# Patient Record
Sex: Female | Born: 1985
Health system: Southern US, Community
[De-identification: ages and names within clinical notes are randomized; demographics above are authoritative.]

## PROBLEM LIST (undated history)

## (undated) ENCOUNTER — Inpatient Hospital Stay (HOSPITAL_COMMUNITY): Payer: Self-pay

## (undated) DIAGNOSIS — N946 Dysmenorrhea, unspecified: Secondary | ICD-10-CM

## (undated) DIAGNOSIS — K589 Irritable bowel syndrome without diarrhea: Secondary | ICD-10-CM

## (undated) DIAGNOSIS — Z8619 Personal history of other infectious and parasitic diseases: Secondary | ICD-10-CM

## (undated) DIAGNOSIS — K449 Diaphragmatic hernia without obstruction or gangrene: Secondary | ICD-10-CM

## (undated) DIAGNOSIS — K219 Gastro-esophageal reflux disease without esophagitis: Secondary | ICD-10-CM

## (undated) DIAGNOSIS — G43909 Migraine, unspecified, not intractable, without status migrainosus: Secondary | ICD-10-CM

## (undated) HISTORY — DX: Personal history of other infectious and parasitic diseases: Z86.19

## (undated) HISTORY — PX: WISDOM TOOTH EXTRACTION: SHX21

## (undated) HISTORY — DX: Dysmenorrhea, unspecified: N94.6

## (undated) HISTORY — PX: ANAL SPHINCTEROPLASTY: SUR1305

## (undated) HISTORY — DX: Migraine, unspecified, not intractable, without status migrainosus: G43.909

---

## 2008-09-30 HISTORY — PX: OTHER SURGICAL HISTORY: SHX169

## 2011-09-12 ENCOUNTER — Ambulatory Visit
Admission: RE | Admit: 2011-09-12 | Discharge: 2011-09-12 | Disposition: A | Discharge status: Home / Self Care | Payer: BC Managed Care – PPO | Source: Ambulatory Visit | Attending: Family Medicine | Admitting: Family Medicine

## 2011-09-12 ENCOUNTER — Other Ambulatory Visit: Payer: Self-pay | Admitting: Family Medicine

## 2011-09-12 DIAGNOSIS — M545 Low back pain: Secondary | ICD-10-CM

## 2012-01-02 DIAGNOSIS — N946 Dysmenorrhea, unspecified: Secondary | ICD-10-CM

## 2012-01-02 DIAGNOSIS — G43909 Migraine, unspecified, not intractable, without status migrainosus: Secondary | ICD-10-CM

## 2012-01-02 DIAGNOSIS — Z8619 Personal history of other infectious and parasitic diseases: Secondary | ICD-10-CM

## 2012-01-06 ENCOUNTER — Encounter: Payer: Self-pay | Admitting: Obstetrics and Gynecology

## 2012-01-06 ENCOUNTER — Ambulatory Visit (INDEPENDENT_AMBULATORY_CARE_PROVIDER_SITE_OTHER): Payer: BC Managed Care – PPO | Admitting: Obstetrics and Gynecology

## 2012-01-06 VITALS — BP 110/74 | HR 100 | Ht 63.25 in | Wt 113.0 lb

## 2012-01-06 DIAGNOSIS — N390 Urinary tract infection, site not specified: Secondary | ICD-10-CM

## 2012-01-06 DIAGNOSIS — R1031 Right lower quadrant pain: Secondary | ICD-10-CM

## 2012-01-06 LAB — POCT URINALYSIS DIPSTICK
Bilirubin, UA: NEGATIVE
Glucose, UA: NEGATIVE
Ketones, UA: NEGATIVE
Spec Grav, UA: <=1.005
Urobilinogen, UA: NEGATIVE

## 2012-01-06 MED ORDER — LEVONORGESTREL-ETHINYL ESTRAD 0.1-20 MG-MCG PO TABS: 1.0000 | ORAL_TABLET | Freq: Every day | ORAL | Status: DC

## 2012-01-06 NOTE — Progress Notes (Signed)
AEX  Last Pap: 2012 per pt WNL: Yes Regular Periods:yes Contraception: pill  Monthly Breast exam:yes Tetanus<6yrs:yes Nl.Bladder Function:yes Daily BMs:yes Healthy Diet:yes Calcium:yes Mammogram:no Date of Mammogram: n/a Exercise:yes Have often Exercise: 2-3 times per week  Seatbelt: yes Abuse at home: no Stressful work:no Sigmoid-colonoscopy: n/a Bone Density: No PCP: Dr. Merri Brunette Change in PMH:  Change in ZOX:WRUE Subjective:    Carol Rocha is a 26 y.o. female, G0P0000, who presents for an annual exam. 2 month hx of occassional RLQ pain.  No nausea, vomiting, dysuria, frequency.  No fever or chills.Pt went to PCP for right side pain, PCP thought she may have small cysts on her ovary.    History   Social History  . Marital Status: Married    Spouse Name: N/A    Number of Children: N/A  . Years of Education: N/A   Social History Main Topics  . Smoking status: Never Smoker   . Smokeless tobacco: None  . Alcohol Use: No  . Drug Use: No  . Sexually Active: Yes    Birth Control/ Protection: Pill     lessina   Other Topics Concern  . None   Social History Narrative  . None    Menstrual cycle:   LMP: Patient's last menstrual period was 12/21/2011.           Cycle:regular withdrawal menses from birth control pills. The only breakthrough bleeding she had was doing traveling in 2 different times while in [place]  The following portions of the patient's history were reviewed and updated as appropriate: allergies, current medications, past family history, past medical history, past social history, past surgical history and problem list.  Review of Systems Pertinent items are noted in HPI. Breast:Negative for breast lump,nipple discharge or nipple retraction Gastrointestinal: Negative for , change in bowel habits or rectal bleeding Urinary:negative   Objective:    BP 110/74  Pulse 100  Ht 5' 3.25" (1.607 m)  Wt 113 lb (51.256 kg)  BMI 19.86 kg/m2   LMP 12/21/2011    Weight:  Wt Readings from Last 1 Encounters:  01/06/12 113 lb (51.256 kg)          BMI: Body mass index is 19.86 kg/(m^2).  General Appearance: Alert, appropriate appearance for age. No acute distress HEENT: Grossly normal Neck / Thyroid: Supple, no masses, nodes or enlargement Lungs: clear to auscultation bilaterally Back: No CVA tenderness Breast Exam: there are areas above the areolar her room of sunburn which has peeled bilaterally.  No masses or nodes.No dimpling, nipple retraction or discharge. Cardiovascular: Regular rate and rhythm. S1, S2, no murmur Gastrointestinal: Soft, non-tender, no masses or organomegaly Pelvic Exam: Vulva and vagina appear normal. Bimanual exam reveals normal uterus and adnexa. Rectovaginal: normal rectal, no masses Lymphatic Exam: Non-palpable nodes in neck, clavicular, axillary, or inguinal regions Skin: no rash or abnormalities Neurologic: Normal gait and speech, no tremor  Psychiatric: Alert and oriented, appropriate affect.   Wet Prep:not applicable Urinalysis:leukocytes trace UPT: none done   Assessment:    right lower quadrant pain  Rule out urinary tract infection  Plan:    pap smear due 2014 Pelvic ultrasound and visit STD screening: declined Contraception:oral contraceptives (estrogen/progesterone)      Gadiel John PMD

## 2012-01-08 LAB — URINE CULTURE
Colony Count: NO GROWTH
Organism ID, Bacteria: NO GROWTH

## 2012-01-22 ENCOUNTER — Encounter: Payer: Self-pay | Admitting: Obstetrics and Gynecology

## 2012-01-22 ENCOUNTER — Ambulatory Visit (INDEPENDENT_AMBULATORY_CARE_PROVIDER_SITE_OTHER): Payer: BC Managed Care – PPO | Admitting: Obstetrics and Gynecology

## 2012-01-22 ENCOUNTER — Other Ambulatory Visit: Payer: Self-pay | Admitting: Obstetrics and Gynecology

## 2012-01-22 ENCOUNTER — Ambulatory Visit (INDEPENDENT_AMBULATORY_CARE_PROVIDER_SITE_OTHER): Payer: BC Managed Care – PPO

## 2012-01-22 VITALS — BP 110/70 | Temp 98.6°F | Wt 112.0 lb

## 2012-01-22 DIAGNOSIS — R1031 Right lower quadrant pain: Secondary | ICD-10-CM

## 2012-01-22 NOTE — Progress Notes (Signed)
F/U rlq pain  SUBJECTIVE: Daily RLQ pain continues, not always associated with activity.  Pt also has right back pain which she associates with pilonidal cyst removal.  OBJECTIVE: BP 110/70  Temp 98.6 F (37 C)  Wt 112 lb (50.803 kg)  LMP 01/18/2012  ULTRASOUND: Uterus: Length: 6.26 cm   Width:  3.49 cm   Height:  3.02 cm  Endo thickness:  0.132cm   Left ovary:Normal Right ovary:Normal Fibroids:no  CDS fluid:no  Comment: Anteverted uterus. Normal uterus. Thin endometrium. Normal ovaries/adnexa. No CDS fluid.  ASSESSMENT: Pelvic pain in RLQ with nl pelvic and U/s exam.  Possible musculoskeletal etiology.  RECOMMENDATION: Referral to physical therapy F/U for aex or prn

## 2012-01-26 ENCOUNTER — Other Ambulatory Visit: Payer: BC Managed Care – PPO

## 2012-06-09 ENCOUNTER — Other Ambulatory Visit: Payer: Self-pay | Admitting: Family Medicine

## 2012-06-10 ENCOUNTER — Other Ambulatory Visit: Payer: Self-pay | Admitting: Family Medicine

## 2012-06-10 DIAGNOSIS — R599 Enlarged lymph nodes, unspecified: Secondary | ICD-10-CM

## 2012-06-14 ENCOUNTER — Ambulatory Visit
Admission: RE | Admit: 2012-06-14 | Discharge: 2012-06-14 | Disposition: A | Discharge status: Home / Self Care | Payer: BC Managed Care – PPO | Source: Ambulatory Visit | Attending: Family Medicine | Admitting: Family Medicine

## 2012-06-14 DIAGNOSIS — R599 Enlarged lymph nodes, unspecified: Secondary | ICD-10-CM

## 2013-09-09 ENCOUNTER — Other Ambulatory Visit: Payer: Self-pay | Admitting: Infectious Disease

## 2013-09-09 ENCOUNTER — Ambulatory Visit
Admission: RE | Admit: 2013-09-09 | Discharge: 2013-09-09 | Disposition: A | Discharge status: Home / Self Care | Payer: No Typology Code available for payment source | Source: Ambulatory Visit | Attending: Infectious Disease | Admitting: Infectious Disease

## 2013-09-09 DIAGNOSIS — A150 Tuberculosis of lung: Secondary | ICD-10-CM

## 2014-05-31 ENCOUNTER — Encounter (HOSPITAL_COMMUNITY): Payer: Self-pay | Admitting: *Deleted

## 2014-05-31 ENCOUNTER — Other Ambulatory Visit (HOSPITAL_COMMUNITY)
Admission: RE | Admit: 2014-05-31 | Discharge: 2014-05-31 | Disposition: A | Discharge status: Home / Self Care | Payer: BC Managed Care – PPO | Source: Ambulatory Visit | Attending: Emergency Medicine | Admitting: Emergency Medicine

## 2014-05-31 ENCOUNTER — Emergency Department (HOSPITAL_COMMUNITY)
Admission: EM | Admit: 2014-05-31 | Discharge: 2014-05-31 | Disposition: A | Discharge status: Home / Self Care | Payer: BC Managed Care – PPO | Source: Home / Self Care | Attending: Emergency Medicine | Admitting: Emergency Medicine

## 2014-05-31 DIAGNOSIS — N760 Acute vaginitis: Secondary | ICD-10-CM | POA: Diagnosis present

## 2014-05-31 DIAGNOSIS — Z01419 Encounter for gynecological examination (general) (routine) without abnormal findings: Secondary | ICD-10-CM | POA: Diagnosis not present

## 2014-05-31 DIAGNOSIS — R1031 Right lower quadrant pain: Secondary | ICD-10-CM

## 2014-05-31 LAB — POCT URINALYSIS DIP (DEVICE)
Bilirubin Urine: NEGATIVE
GLUCOSE, UA: NEGATIVE mg/dL
Ketones, ur: NEGATIVE mg/dL
Leukocytes, UA: NEGATIVE
NITRITE: NEGATIVE
PROTEIN: NEGATIVE mg/dL
Specific Gravity, Urine: 1.02 (ref 1.005–1.030)
Urobilinogen, UA: 0.2 mg/dL (ref 0.0–1.0)
pH: 7.5 (ref 5.0–8.0)

## 2014-05-31 LAB — CBC WITH DIFFERENTIAL/PLATELET
BASOS ABS: 0 10*3/uL (ref 0.0–0.1)
Basophils Relative: 0 % (ref 0–1)
EOS PCT: 1 % (ref 0–5)
Eosinophils Absolute: 0.1 10*3/uL (ref 0.0–0.7)
HEMATOCRIT: 36.4 % (ref 36.0–46.0)
Hemoglobin: 11.9 g/dL — ABNORMAL LOW (ref 12.0–15.0)
LYMPHS PCT: 23 % (ref 12–46)
Lymphs Abs: 1.5 10*3/uL (ref 0.7–4.0)
MCH: 28.5 pg (ref 26.0–34.0)
MCHC: 32.7 g/dL (ref 30.0–36.0)
MCV: 87.1 fL (ref 78.0–100.0)
MONOS PCT: 5 % (ref 3–12)
Monocytes Absolute: 0.3 10*3/uL (ref 0.1–1.0)
NEUTROS PCT: 71 % (ref 43–77)
Neutro Abs: 4.8 10*3/uL (ref 1.7–7.7)
PLATELETS: 347 10*3/uL (ref 150–400)
RBC: 4.18 MIL/uL (ref 3.87–5.11)
RDW: 13 % (ref 11.5–15.5)
WBC: 6.8 10*3/uL (ref 4.0–10.5)

## 2014-05-31 LAB — CERVICOVAGINAL ANCILLARY ONLY
WET PREP (BD AFFIRM): NEGATIVE
Wet Prep (BD Affirm): NEGATIVE
Wet Prep (BD Affirm): NEGATIVE

## 2014-05-31 LAB — POCT I-STAT, CHEM 8
BUN: 17 mg/dL (ref 6–23)
CALCIUM ION: 1.17 mmol/L (ref 1.12–1.23)
Chloride: 102 mEq/L (ref 96–112)
Creatinine, Ser: 0.7 mg/dL (ref 0.50–1.10)
Glucose, Bld: 122 mg/dL — ABNORMAL HIGH (ref 70–99)
HEMATOCRIT: 38 % (ref 36.0–46.0)
Hemoglobin: 12.9 g/dL (ref 12.0–15.0)
Potassium: 3.9 mmol/L (ref 3.5–5.1)
Sodium: 136 mmol/L (ref 135–145)
TCO2: 25 mmol/L (ref 0–100)

## 2014-05-31 LAB — POCT PREGNANCY, URINE: Preg Test, Ur: NEGATIVE

## 2014-05-31 MED ORDER — ONDANSETRON 4 MG PO TBDP: 4.0000 mg | ORAL_TABLET | Freq: Four times a day (QID) | ORAL | Status: DC | PRN

## 2014-05-31 MED ORDER — IBUPROFEN 800 MG PO TABS: 800.0000 mg | ORAL_TABLET | Freq: Three times a day (TID) | ORAL | Status: DC | PRN

## 2014-05-31 MED ORDER — HYDROCODONE-ACETAMINOPHEN 5-325 MG PO TABS: 1.0000 | ORAL_TABLET | Freq: Four times a day (QID) | ORAL | Status: DC | PRN

## 2014-05-31 NOTE — ED Notes (Signed)
Pt reports   rlq  Pain        Since  yest         Vomited  As  Well       Pt  Taking  Clindamycin      For  An impending        Dental procedure             Later  Today   She  Denies  Any       Vagina bleeding or  Discharge    Pt  Ambulated  To room  With a  Steady  Fluid  Gait

## 2014-05-31 NOTE — Discharge Instructions (Signed)
Abdominal Pain, Women °Abdominal (stomach, pelvic, or belly) pain can be caused by many things. It is important to tell your doctor: °· The location of the pain. °· Does it come and go or is it present all the time? °· Are there things that start the pain (eating certain foods, exercise)? °· Are there other symptoms associated with the pain (fever, nausea, vomiting, diarrhea)? °All of this is helpful to know when trying to find the cause of the pain. °CAUSES  °· Stomach: virus or bacteria infection, or ulcer. °· Intestine: appendicitis (inflamed appendix), regional ileitis (Crohn's disease), ulcerative colitis (inflamed colon), irritable bowel syndrome, diverticulitis (inflamed diverticulum of the colon), or cancer of the stomach or intestine. °· Gallbladder disease or stones in the gallbladder. °· Kidney disease, kidney stones, or infection. °· Pancreas infection or cancer. °· Fibromyalgia (pain disorder). °· Diseases of the female organs: °¨ Uterus: fibroid (non-cancerous) tumors or infection. °¨ Fallopian tubes: infection or tubal pregnancy. °¨ Ovary: cysts or tumors. °¨ Pelvic adhesions (scar tissue). °¨ Endometriosis (uterus lining tissue growing in the pelvis and on the pelvic organs). °¨ Pelvic congestion syndrome (female organs filling up with blood just before the menstrual period). °¨ Pain with the menstrual period. °¨ Pain with ovulation (producing an egg). °¨ Pain with an IUD (intrauterine device, birth control) in the uterus. °¨ Cancer of the female organs. °· Functional pain (pain not caused by a disease, may improve without treatment). °· Psychological pain. °· Depression. °DIAGNOSIS  °Your doctor will decide the seriousness of your pain by doing an examination. °· Blood tests. °· X-rays. °· Ultrasound. °· CT scan (computed tomography, special type of X-ray). °· MRI (magnetic resonance imaging). °· Cultures, for infection. °· Barium enema (dye inserted in the large intestine, to better view it with  X-rays). °· Colonoscopy (looking in intestine with a lighted tube). °· Laparoscopy (minor surgery, looking in abdomen with a lighted tube). °· Major abdominal exploratory surgery (looking in abdomen with a large incision). °TREATMENT  °The treatment will depend on the cause of the pain.  °· Many cases can be observed and treated at home. °· Over-the-counter medicines recommended by your caregiver. °· Prescription medicine. °· Antibiotics, for infection. °· Birth control pills, for painful periods or for ovulation pain. °· Hormone treatment, for endometriosis. °· Nerve blocking injections. °· Physical therapy. °· Antidepressants. °· Counseling with a psychologist or psychiatrist. °· Minor or major surgery. °HOME CARE INSTRUCTIONS  °· Do not take laxatives, unless directed by your caregiver. °· Take over-the-counter pain medicine only if ordered by your caregiver. Do not take aspirin because it can cause an upset stomach or bleeding. °· Try a clear liquid diet (broth or water) as ordered by your caregiver. Slowly move to a bland diet, as tolerated, if the pain is related to the stomach or intestine. °· Have a thermometer and take your temperature several times a day, and record it. °· Bed rest and sleep, if it helps the pain. °· Avoid sexual intercourse, if it causes pain. °· Avoid stressful situations. °· Keep your follow-up appointments and tests, as your caregiver orders. °· If the pain does not go away with medicine or surgery, you may try: °¨ Acupuncture. °¨ Relaxation exercises (yoga, meditation). °¨ Group therapy. °¨ Counseling. °SEEK MEDICAL CARE IF:  °· You notice certain foods cause stomach pain. °· Your home care treatment is not helping your pain. °· You need stronger pain medicine. °· You want your IUD removed. °· You feel faint or   lightheaded. °· You develop nausea and vomiting. °· You develop a rash. °· You are having side effects or an allergy to your medicine. °SEEK IMMEDIATE MEDICAL CARE IF:  °· Your  pain does not go away or gets worse. °· You have a fever. °· Your pain is felt only in portions of the abdomen. The right side could possibly be appendicitis. The left lower portion of the abdomen could be colitis or diverticulitis. °· You are passing blood in your stools (bright red or black tarry stools, with or without vomiting). °· You have blood in your urine. °· You develop chills, with or without a fever. °· You pass out. °MAKE SURE YOU:  °· Understand these instructions. °· Will watch your condition. °· Will get help right away if you are not doing well or get worse. °Document Released: 03/16/2007 Document Revised: 10/03/2013 Document Reviewed: 04/05/2009 °Exit[employer]® Patient Information ©2015 [employer]. This information is not intended to replace advice given to you by your health care provider. Make sure you discuss any questions you have with your health care provider. ° °

## 2014-05-31 NOTE — ED Provider Notes (Signed)
CSN: [id number]     Arrival date & time 05/31/14  [id number] History   First MD Initiated Contact with Patient 05/31/14 0845     Chief Complaint  Patient presents with  . Abdominal Pain   (Consider location/radiation/quality/duration/timing/severity/associated sxs/prior Treatment) HPI          28 year old female presents for evaluation of abdominal pain. She has right lower quadrant abdominal pain since last night. It is cramping in nature, nonradiating. She had vomiting last night as well. The pain was intermittent but has gotten to be more constant today. No fever, chills, constipation, diarrhea. She is currently on her period. No recent travel or sick contacts. No history of stomach surgeries. She is currently on clindamycin for a dental infection.  She is sexually active, denies vaginal discharge apart from bleeding from period.     Past Medical History  Diagnosis Date  . History of chicken pox   . Migraine   . Dysmenorrhea    Past Surgical History  Procedure Laterality Date  . Wisdom tooth extraction     History reviewed. No pertinent family history. History  Substance Use Topics  . Smoking status: Never Smoker   . Smokeless tobacco: Not on file  . Alcohol Use: No   OB History    Gravida Para Term Preterm AB TAB SAB Ectopic Multiple Living   0 0 0 0 0 0 0 0 0 0      Review of Systems  Constitutional: Negative for fever and chills.  Respiratory: Negative for shortness of breath.   Cardiovascular: Negative for chest pain.  Gastrointestinal: Positive for nausea, vomiting and abdominal pain. Negative for diarrhea, constipation and blood in stool.  Genitourinary: Positive for vaginal bleeding. Negative for dysuria, urgency, hematuria, vaginal discharge and vaginal pain.  All other systems reviewed and are negative.   Allergies  Review of patient's allergies indicates no known allergies.  Home Medications   Prior to Admission medications   Medication Sig Start Date End Date  Taking? Authorizing Provider  HYDROcodone-acetaminophen (NORCO) 5-325 MG per tablet Take 1 tablet by mouth every 6 (six) hours as needed for moderate pain. 05/31/14   Graylon GoodZachary H Samaya Boardley, PA-C  ibuprofen (ADVIL,MOTRIN) 800 MG tablet Take 1 tablet (800 mg total) by mouth every 8 (eight) hours as needed. 05/31/14   Graylon GoodZachary H Mahonri Seiden, PA-C  levonorgestrel-ethinyl estradiol (AVIANE,ALESSE,LESSINA) 0.1-20 MG-MCG tablet Take 1 tablet by mouth daily. 01/06/12   Hal MoralesVanessa P Haygood, MD  ondansetron (ZOFRAN-ODT) 4 MG disintegrating tablet Take 1 tablet (4 mg total) by mouth every 6 (six) hours as needed for nausea. PRN for nausea or vomiting 05/31/14   Graylon GoodZachary H Azreal Stthomas, PA-C   BP 122/82 mmHg  Pulse 109  Temp(Src) 98.2 F (36.8 C) (Oral)  Resp 16  SpO2 99%  LMP 05/26/2014 Physical Exam  Constitutional: She is oriented to person, place, and time. Vital signs are normal. She appears well-developed and well-nourished. No distress.  HENT:  Head: Normocephalic and atraumatic.  Cardiovascular: Normal rate, regular rhythm and normal heart sounds.   Pulse 100 when rechecked manually   Pulmonary/Chest: Effort normal and breath sounds normal. No respiratory distress.  Abdominal: Soft. Normal appearance and bowel sounds are normal. She exhibits no mass. There is no hepatosplenomegaly. There is no tenderness. There is no rigidity, no rebound, no guarding, no CVA tenderness, no tenderness at McBurney's point and negative Murphy's sign.  Genitourinary: Cervix exhibits no motion tenderness, no discharge and no friability. Right adnexum displays no mass, no tenderness and  no fullness. Left adnexum displays no mass, no tenderness and no fullness. There is bleeding in the vagina. No erythema or tenderness in the vagina. No vaginal discharge found.  Lymphadenopathy:       Right: No inguinal adenopathy present.       Left: No inguinal adenopathy present.  Neurological: She is alert and oriented to person, place, and time. She has  normal strength. Coordination normal.  Skin: Skin is warm and dry. No rash noted. She is not diaphoretic.  Psychiatric: She has a normal mood and affect. Judgment normal.  Nursing note and vitals reviewed.   ED Course  Procedures (including critical care time) Labs Review Labs Reviewed  CBC WITH DIFFERENTIAL - Abnormal; Notable for the following:    Hemoglobin 11.9 (*)    All other components within normal limits  POCT URINALYSIS DIP (DEVICE) - Abnormal; Notable for the following:    Hgb urine dipstick SMALL (*)    All other components within normal limits  POCT I-STAT, CHEM 8 - Abnormal; Notable for the following:    Glucose, Bld 122 (*)    All other components within normal limits  POCT PREGNANCY, URINE  CERVICOVAGINAL ANCILLARY ONLY    Imaging Review No results found.   MDM   1. Abdominal pain, RLQ    Abdomen is soft and nontender. She is afebrile, nontoxic. Labs are normal. Treat symptomatically for now, advised to go to the emergency department if worsening. No acute surgical abdomen suspected at this time   Meds ordered this encounter  Medications  . ibuprofen (ADVIL,MOTRIN) 800 MG tablet    Sig: Take 1 tablet (800 mg total) by mouth every 8 (eight) hours as needed.    Dispense:  20 tablet    Refill:  0    Order Specific Question:  Supervising Provider    Answer:  Lorenz CoasterKELLER, DAVID C [id number]]  . HYDROcodone-acetaminophen (NORCO) 5-325 MG per tablet    Sig: Take 1 tablet by mouth every 6 (six) hours as needed for moderate pain.    Dispense:  10 tablet    Refill:  0    Order Specific Question:  Supervising Provider    Answer:  Lorenz CoasterKELLER, DAVID C [id number]]  . ondansetron (ZOFRAN-ODT) 4 MG disintegrating tablet    Sig: Take 1 tablet (4 mg total) by mouth every 6 (six) hours as needed for nausea. PRN for nausea or vomiting    Dispense:  12 tablet    Refill:  0    Order Specific Question:  Supervising Provider    Answer:  Lorenz CoasterKELLER, DAVID C [[id number]]       Graylon GoodZachary H Larson Limones,  PA-C 05/31/14 [phone]

## 2014-06-01 LAB — CERVICOVAGINAL ANCILLARY ONLY
Chlamydia: NEGATIVE
Neisseria Gonorrhea: NEGATIVE

## 2014-07-06 ENCOUNTER — Other Ambulatory Visit: Payer: Self-pay | Admitting: Gastroenterology

## 2014-07-06 DIAGNOSIS — R110 Nausea: Secondary | ICD-10-CM

## 2014-07-06 DIAGNOSIS — R1033 Periumbilical pain: Secondary | ICD-10-CM

## 2014-07-27 ENCOUNTER — Ambulatory Visit (HOSPITAL_COMMUNITY)
Admission: RE | Admit: 2014-07-27 | Discharge: 2014-07-27 | Disposition: A | Discharge status: Home / Self Care | Payer: BLUE CROSS/BLUE SHIELD | Source: Ambulatory Visit | Attending: Gastroenterology | Admitting: Gastroenterology

## 2014-07-27 DIAGNOSIS — R1033 Periumbilical pain: Secondary | ICD-10-CM

## 2014-07-27 DIAGNOSIS — R112 Nausea with vomiting, unspecified: Secondary | ICD-10-CM | POA: Diagnosis not present

## 2014-07-27 DIAGNOSIS — R110 Nausea: Secondary | ICD-10-CM

## 2014-07-27 MED ORDER — SINCALIDE 5 MCG IJ SOLR
INTRAMUSCULAR | Status: AC
  Administered 2014-07-27: 1.09 ug via INTRAVENOUS
  Filled 2014-07-27: qty 5

## 2014-07-27 MED ORDER — TECHNETIUM TC 99M MEBROFENIN IV KIT
5.0000 | PACK | Freq: Once | INTRAVENOUS | Status: AC | PRN
  Administered 2014-07-27: 5 via INTRAVENOUS

## 2014-07-27 MED ORDER — SINCALIDE 5 MCG IJ SOLR
0.0200 ug/kg | Freq: Once | INTRAMUSCULAR | Status: AC
  Administered 2014-07-27: 1.09 ug via INTRAVENOUS

## 2014-12-15 LAB — OB RESULTS CONSOLE HEPATITIS B SURFACE ANTIGEN: HEP B S AG: NEGATIVE

## 2014-12-15 LAB — OB RESULTS CONSOLE RPR: RPR: NONREACTIVE

## 2014-12-15 LAB — OB RESULTS CONSOLE GC/CHLAMYDIA
Chlamydia: NEGATIVE
GC PROBE AMP, GENITAL: NEGATIVE

## 2014-12-15 LAB — OB RESULTS CONSOLE HIV ANTIBODY (ROUTINE TESTING): HIV: NONREACTIVE

## 2014-12-15 LAB — OB RESULTS CONSOLE ANTIBODY SCREEN: ANTIBODY SCREEN: NEGATIVE

## 2014-12-15 LAB — OB RESULTS CONSOLE RUBELLA ANTIBODY, IGM: RUBELLA: IMMUNE

## 2014-12-15 LAB — OB RESULTS CONSOLE ABO/RH: RH Type: POSITIVE

## 2015-06-03 ENCOUNTER — Inpatient Hospital Stay (HOSPITAL_COMMUNITY)
Admission: AD | Admit: 2015-06-03 | Payer: BLUE CROSS/BLUE SHIELD | Source: Ambulatory Visit | Admitting: Obstetrics and Gynecology

## 2015-06-03 NOTE — L&D Delivery Note (Signed)
Delivery Note After two and a hours of active pushing and management, patient reports exhaustion.  FHR remained reassuring and pushing efforts remained optimal.  However, Dr. Delrae Sawyers. Jamond Neels called to the bedside for consult and assessment.  In room to assess and discuss availability, including r/b of vacuum extraction.  Patient opts to push a longer and delivered as below with staff and husband support.   At 1:16 AM, on Jul 09, 2015, a viable female "Carol Rocha" was delivered via Vaginal, Spontaneous Delivery (Presentation: Left Occiput Anterior) by Dr. Gerald Leitzara [facility]ks[facility].  Shoulders delivered easily and infant with good tone and spontaneous cry. Tactile stimulation and bulb suction given by doctor and infant placed on mother's abdomen where nurse continued tactile stimulation.  Infant  APGAR: 9, 9. Cord clamped, cut, and blood collected. Placenta delivered spontaneously and noted to be intact with 3VC upon inspection by provider.  Vaginal inspection revealed a 4th degree perineal laceration that was repaired by Dr. Gerald Leitzara Ary Rudnick.  Lidocaine 1% was utilized locally and patient tolerated the procedure well. Fundus firm, at the umbilicus, and bleeding small.  Mother hemodynamically stable and infant skin to skin prior to provider exit.  Mother desires pills and condoms for birth control and opts to breastfeed.  Family wishes for infant to be circumcised during inpatient stay.  Infant weight at one hour of life: 8 lb 3.4 oz (3725 g).    Anesthesia: Epidural  Episiotomy: None Lacerations: 4th degree Suture Repair: 2.0 3.0 vicryl monocryl Est. Blood Loss (mL):    Mom to postpartum.  Baby to Couplet care / Skin to Skin.  Cherre RobinsJessica L Emly MSN, CNM 08/06/2015, 2:01 AM  Agree with above. Pt was noted to have a 4 th degree perineal laceration The rectal mucosa was re-approximated with 4-0 vicryl. The Anal sphincter was re-approximated with 2-0 monocryl. The bulbocavernosus muscle and  vaginal mucosa were re-approximated with  3-0 vicryl. Excellent hemostasis was noted. Pt tolerated the repair well. Plan to start stool softener daily postpartum.

## 2015-07-17 LAB — OB RESULTS CONSOLE GBS: GBS: NEGATIVE

## 2015-08-05 ENCOUNTER — Inpatient Hospital Stay (HOSPITAL_COMMUNITY)
Admission: AD | Admit: 2015-08-05 | Discharge: 2015-08-08 | DRG: 775 | Disposition: A | Discharge status: Home / Self Care | Payer: BLUE CROSS/BLUE SHIELD | Source: Ambulatory Visit | Attending: Obstetrics and Gynecology | Admitting: Obstetrics and Gynecology

## 2015-08-05 ENCOUNTER — Inpatient Hospital Stay (HOSPITAL_COMMUNITY): Payer: BLUE CROSS/BLUE SHIELD | Admitting: Anesthesiology

## 2015-08-05 ENCOUNTER — Encounter (HOSPITAL_COMMUNITY): Payer: Self-pay | Admitting: *Deleted

## 2015-08-05 DIAGNOSIS — O9081 Anemia of the puerperium: Secondary | ICD-10-CM | POA: Diagnosis not present

## 2015-08-05 DIAGNOSIS — O4292 Full-term premature rupture of membranes, unspecified as to length of time between rupture and onset of labor: Principal | ICD-10-CM | POA: Diagnosis present

## 2015-08-05 DIAGNOSIS — Z3A38 38 weeks gestation of pregnancy: Secondary | ICD-10-CM | POA: Diagnosis not present

## 2015-08-05 DIAGNOSIS — D62 Acute posthemorrhagic anemia: Secondary | ICD-10-CM | POA: Diagnosis not present

## 2015-08-05 HISTORY — DX: Irritable bowel syndrome, unspecified: K58.9

## 2015-08-05 HISTORY — DX: Gastro-esophageal reflux disease without esophagitis: K21.9

## 2015-08-05 HISTORY — DX: Diaphragmatic hernia without obstruction or gangrene: K44.9

## 2015-08-05 LAB — TYPE AND SCREEN
ABO/RH(D): O POS
ANTIBODY SCREEN: NEGATIVE

## 2015-08-05 LAB — CBC
HCT: 40 % (ref 36.0–46.0)
Hemoglobin: 14 g/dL (ref 12.0–15.0)
MCH: 31.8 pg (ref 26.0–34.0)
MCHC: 35 g/dL (ref 30.0–36.0)
MCV: 90.9 fL (ref 78.0–100.0)
Platelets: 297 10*3/uL (ref 150–400)
RBC: 4.4 MIL/uL (ref 3.87–5.11)
RDW: 13.3 % (ref 11.5–15.5)
WBC: 15.3 10*3/uL — ABNORMAL HIGH (ref 4.0–10.5)

## 2015-08-05 LAB — ABO/RH: ABO/RH(D): O POS

## 2015-08-05 MED ORDER — ACETAMINOPHEN 325 MG PO TABS: 650.0000 mg | ORAL_TABLET | ORAL | Status: DC | PRN

## 2015-08-05 MED ORDER — ACETAMINOPHEN 500 MG PO TABS
1000.0000 mg | ORAL_TABLET | Freq: Once | ORAL | Status: AC
  Administered 2015-08-05: 1000 mg via ORAL
  Filled 2015-08-05: qty 2

## 2015-08-05 MED ORDER — BUTORPHANOL TARTRATE 1 MG/ML IJ SOLN: 1.0000 mg | INTRAMUSCULAR | Status: DC | PRN

## 2015-08-05 MED ORDER — OXYCODONE-ACETAMINOPHEN 5-325 MG PO TABS: 2.0000 | ORAL_TABLET | ORAL | Status: DC | PRN

## 2015-08-05 MED ORDER — OXYTOCIN BOLUS FROM INFUSION
500.0000 mL | INTRAVENOUS | Status: DC
  Administered 2015-08-06: 500 mL via INTRAVENOUS

## 2015-08-05 MED ORDER — LACTATED RINGERS IV SOLN
500.0000 mL | INTRAVENOUS | Status: DC | PRN
  Administered 2015-08-05: 500 mL via INTRAVENOUS

## 2015-08-05 MED ORDER — DIPHENHYDRAMINE HCL 50 MG/ML IJ SOLN: 12.5000 mg | INTRAMUSCULAR | Status: DC | PRN

## 2015-08-05 MED ORDER — LACTATED RINGERS IV SOLN: 500.0000 mL | Freq: Once | INTRAVENOUS | Status: DC

## 2015-08-05 MED ORDER — EPHEDRINE 5 MG/ML INJ: 10.0000 mg | INTRAVENOUS | Status: DC | PRN

## 2015-08-05 MED ORDER — OXYTOCIN 10 UNIT/ML IJ SOLN
2.5000 [IU]/h | INTRAMUSCULAR | Status: DC
  Filled 2015-08-05: qty 10

## 2015-08-05 MED ORDER — FENTANYL 2.5 MCG/ML BUPIVACAINE 1/10 % EPIDURAL INFUSION (WH - ANES): 14.0000 mL/h | INTRAMUSCULAR | Status: DC | PRN

## 2015-08-05 MED ORDER — CITRIC ACID-SODIUM CITRATE 334-500 MG/5ML PO SOLN: 30.0000 mL | ORAL | Status: DC | PRN

## 2015-08-05 MED ORDER — LACTATED RINGERS IV SOLN
INTRAVENOUS | Status: DC
  Administered 2015-08-05 (×3): via INTRAVENOUS

## 2015-08-05 MED ORDER — LACTATED RINGERS IV SOLN
500.0000 mL | Freq: Once | INTRAVENOUS | Status: AC
  Administered 2015-08-05: 500 mL via INTRAVENOUS

## 2015-08-05 MED ORDER — PHENYLEPHRINE 40 MCG/ML (10ML) SYRINGE FOR IV PUSH (FOR BLOOD PRESSURE SUPPORT): 80.0000 ug | PREFILLED_SYRINGE | INTRAVENOUS | Status: DC | PRN

## 2015-08-05 MED ORDER — LIDOCAINE HCL (PF) 1 % IJ SOLN
INTRAMUSCULAR | Status: DC | PRN
  Administered 2015-08-05 (×2): 5 mL via EPIDURAL

## 2015-08-05 MED ORDER — PHENYLEPHRINE 40 MCG/ML (10ML) SYRINGE FOR IV PUSH (FOR BLOOD PRESSURE SUPPORT)
80.0000 ug | PREFILLED_SYRINGE | INTRAVENOUS | Status: DC | PRN
  Administered 2015-08-05: 80 ug via INTRAVENOUS
  Filled 2015-08-05: qty 2

## 2015-08-05 MED ORDER — LIDOCAINE HCL (PF) 1 % IJ SOLN
30.0000 mL | INTRAMUSCULAR | Status: AC | PRN
  Administered 2015-08-06: 30 mL via SUBCUTANEOUS
  Filled 2015-08-05: qty 30

## 2015-08-05 MED ORDER — PHENYLEPHRINE 40 MCG/ML (10ML) SYRINGE FOR IV PUSH (FOR BLOOD PRESSURE SUPPORT)
80.0000 ug | PREFILLED_SYRINGE | INTRAVENOUS | Status: DC | PRN
  Filled 2015-08-05: qty 20
  Filled 2015-08-05: qty 2
  Filled 2015-08-05: qty 20

## 2015-08-05 MED ORDER — OXYTOCIN 10 UNIT/ML IJ SOLN
1.0000 m[IU]/min | INTRAVENOUS | Status: DC
  Administered 2015-08-05: 2 m[IU]/min via INTRAVENOUS

## 2015-08-05 MED ORDER — EPHEDRINE 5 MG/ML INJ
10.0000 mg | INTRAVENOUS | Status: DC | PRN
  Filled 2015-08-05: qty 2

## 2015-08-05 MED ORDER — FENTANYL 2.5 MCG/ML BUPIVACAINE 1/10 % EPIDURAL INFUSION (WH - ANES)
14.0000 mL/h | INTRAMUSCULAR | Status: DC | PRN
  Administered 2015-08-05 (×2): 14 mL/h via EPIDURAL
  Filled 2015-08-05: qty 125

## 2015-08-05 MED ORDER — ONDANSETRON HCL 4 MG/2ML IJ SOLN: 4.0000 mg | Freq: Four times a day (QID) | INTRAMUSCULAR | Status: DC | PRN

## 2015-08-05 MED ORDER — OXYCODONE-ACETAMINOPHEN 5-325 MG PO TABS: 1.0000 | ORAL_TABLET | ORAL | Status: DC | PRN

## 2015-08-05 NOTE — Progress Notes (Signed)
Labor Progress  Subjective: No complaints.  On right with peanut.  Extremely numb legs, no feeling.  No urge to push  Objective: BP 88/41 mmHg  Pulse 82  Temp(Src) 98.2 F (36.8 C) (Oral)  Resp 18  Ht 5\' 3"  (1.6 m)  Wt 160 lb (72.576 kg)  BMI 28.35 kg/m2  SpO2 100%  LMP 12/08/2014    FHT: 140, moderate variability, + accel, no decels CTX:  regular, every 4-5 minutes Uterus gravid, soft non tender SVE:  C/C/+1   Assessment:  IUP at 38.5 weeks NICHD: Category  1 Membranes:  SROM x 9.5hrs, no s/s of infection Labor progress: transition GBS: negtive  Plan: Continue labor plan Continuous monitoring Rest Labor down with peanut ball, moving from side to side to encourage fetal descent       Carol Rocha, CNM, MSN 08/05/2015. 5:35 PM

## 2015-08-05 NOTE — Consults (Signed)
  Anesthesia Pain Consult Note  Patient: Carol Rocha, 30 y.o., female  Consult Requested by: Osborn CohoAngela Roberts, MD  Reason for Consult: Crna pain rounds  Level of Consciousness: alert  Pain: current pain 9, pain goal 6 planning epidural     [facility] Hospital[facility]GER,Carol Rocha 08/05/2015

## 2015-08-05 NOTE — H&P (Signed)
Carol Rocha is a 30 y.o. female, G1 P0 at 38.5 weeks  Patient Active Problem List   Diagnosis Date Noted  . History of chicken pox   . Migraine   . Dysmenorrhea     Pregnancy Course: Patient entered care at 5.3 weeks.   EDC of 08/14/15 was established by US.      US evaluations:   12.3 weeks -1st trimester screen. WNL,  FHR 152, anterverted uterus  19.1 weeks - Anatomy: EFW 11oz, cervix 3.68, FHR 156, vertex, posterior placenta, no previa, boy      Significant prenatal events:   none   Last evaluation:   38.5 weeks   VE:0/30/-3 on/28/17  Reason for admission:  SROM at 0800  Pt States:   Contractions Frequency: 3-4         Contraction severity: strong         Fetal activity: +FM  OB History    Gravida Para Term Preterm AB TAB SAB Ectopic Multiple Living   1 0 0 0 0 0 0 0 0 0      Past Medical History  Diagnosis Date  . History of chicken pox   . Migraine   . Dysmenorrhea    Past Surgical History  Procedure Laterality Date  . Wisdom tooth extraction     Family History: family history is not on file. Social History:  reports that she has never smoked. She does not have any smokeless tobacco history on file. She reports that she does not drink alcohol or use illicit drugs.   Prenatal Transfer Tool  Maternal Diabetes: No Genetic Screening: Normal Maternal Ultrasounds/Referrals: Normal Fetal Ultrasounds or other Referrals:  None Maternal Substance Abuse:  No Significant Maternal Medications:  None Significant Maternal Lab Results: None   ROS:  See HPI above, all other systems are negative  No Known Allergies  Dilation: 5 Exam by:: V. Abrie Egloff, CNM Blood pressure 116/61, pulse 106, temperature 97.8 F (36.6 C), temperature source Oral, resp. rate 98, last menstrual period 12/08/2014.  Maternal Exam:  Uterine Assessment: Contraction frequency is rare.  Abdomen: Gravid, non tender. Fundal height is aga.  Normal external genitalia, vulva, cervix, uterus and  adnexa.  No lesions noted on exam.  Pelvis adequate for delivery.  Fetal presentation: Vertex by VE  Fetal Exam:  Monitor Surveillance : Continuous Monitoring / Mode: Ultrasound.  NICHD: Category 1 CTXs: Q 4-295minutes EFW   7.5 lbs  Physical Exam: Nursing note and vitals reviewed General: alert and cooperative She appears well nourished Psychiatric: Normal mood and affect. Her behavior is normal Head: Normocephalic Eyes: Pupils are equal, round, and reactive to light Neck: Normal range of motion Cardiovascular: RRR without murmur  Respiratory: CTAB. Effort normal  Abd: soft, non-tender, +BS, no rebound, no guarding  Genitourinary: Vagina normal  Neurological: A&Ox3 Skin: Warm and dry  Musculoskeletal: Normal range of motion  Homan's sign negative bilaterally No evidence of DVTs.  Edema: Minimal bilaterally non-pitting edema DTR: 2+ Clonus: None   Prenatal labs: ABO, Rh:  O positive Antibody:  neg Rubella:  immune RPR:   NR HBsAg:   neg HIV:   NR GBS:  neg 07/17/15 Sickle cell/Hgb electrophoresis:  WNL Pap:  wnl 01/20/15 GC:   neg Chlamydia: neg Genetic screenings:  wnl Glucola:  neg  Assessment:  IUP at 38.5 weeks NICHD: Category 1 Membranes: SROM x 5hrs GBS neg  Plan:  Admit to L&D for expectant/active management of labor. Possible augmentation options reviewed including pitocin.  IV pain  medication per orders PRN Epidural per patient request Foley cath after patient is comfortable with epidural Anticipate SVD  Labor mgmt as ordered   Okay to ambulate around unit with wireless monitors  Okay to get up and shower without monitoring    Attending MD available at all times.    Lavoris Canizales, CNM, MSN 08/05/2015, 12:46 PM

## 2015-08-05 NOTE — Progress Notes (Signed)
Carol Rocha MRN: [id number]  Subjective: -In room to resume pushing after decrease in epidural.  Patient continues to deny rectal and vaginal pressure.    Objective: BP 95/69 mmHg  Pulse 157  Temp(Src) 99.7 F (37.6 C) (Oral)  Resp 20  Ht 5\' 3"  (1.6 m)  Wt 72.576 kg (160 lb)  BMI 28.35 kg/m2  SpO2 100%  LMP 12/08/2014   Total I/O In: -  Out: 500 [Urine:500]  Fetal Monitoring: FHT: 160 bpm, Mod Var, +Variable Decels, +Accels UC: Q623min, palpates moderate    Vaginal Exam: SVE:   Dilation: 10 Effacement (%): 100 Station: +2 Exam by:: Faaris Arizpe, CNM Membranes:SROM Internal Monitors: None  Augmentation/Induction: Pitocin:None Cytotec: None  Assessment:  IUP at 38.6wks Cat I FT  2nd Stage Labor  Plan: -Start pushing -Will remain at bedside -Anticipate SVD -Continue other mgmt as ordered  Valma CavaJessica L Sparrow [place],MSN, CNM 08/05/2015, 10:48 PM   Addendum (2330) -Dr. Delrae Sawyers. Cole updated on patient status.  Instructed to discontinue epidural to allow for perception of rectal pressure. Also instructed to start pitocin for augmentation -Will start pitocin at 582mUn/min -Epidural infusion discontinued, Dr. Roxanna Mew. [place] notified -Will remain at bedside and continue pushing  Cherre RobinsJessica L Rozalyn Osland MSN, CNM

## 2015-08-05 NOTE — Progress Notes (Signed)
Labor Progress  Subjective: No complaints.  Dense epidural.  Unable to feel VE, no desire to push or the urge for a BM  Objective: BP 111/56 mmHg  Pulse 86  Temp(Src) 98.2 F (36.8 C) (Oral)  Resp 18  Ht 5\' 3"  (1.6 m)  Wt 160 lb (72.576 kg)  BMI 28.35 kg/m2  SpO2 100%  LMP 12/08/2014     FHT: 130, moderate variability, + accel, no decel CTX:  regular, every 4-5 minutes Uterus gravid, soft non tender SVE:  Dilation: 10 Effacement (%): 100 Station: +1 Exam by:: V Wendall Isabell, CNM   Assessment:  IUP at 38.5 weeks NICHD: Category 1 Membranes:  SROM x 10.5hrs, no s/s of infection Labor progress: laboring down GBS: negative  Plan: Continue labor plan Continuous monitoring Frequent position changes to facilitate fetal rotation and descent. Report [facility]     Carol Rocha, CNM, MSN 08/05/2015. 6:37 PM

## 2015-08-05 NOTE — Anesthesia Procedure Notes (Signed)
Epidural Patient location during procedure: OB Start time: 08/05/2015 1:57 PM End time: 08/05/2015 2:02 PM  Staffing Anesthesiologist: Ronelle NighEWELL, Chasta Deshpande Performed by: anesthesiologist   Preanesthetic Checklist Completed: patient identified, site marked, surgical consent, pre-op evaluation, timeout performed, IV checked, risks and benefits discussed and monitors and equipment checked  Epidural Patient position: sitting Prep: site prepped and draped and DuraPrep Patient monitoring: continuous pulse ox and blood pressure Approach: midline Location: L3-L4 Injection technique: LOR air  Needle:  Needle type: Tuohy  Needle gauge: 17 G Needle length: 9 cm and 9 Needle insertion depth: 6 cm Catheter type: closed end flexible Catheter size: 19 Gauge Catheter at skin depth: 11 cm Test dose: negative  Assessment Sensory level: T10 Events: blood not aspirated, injection not painful, no injection resistance, negative IV test and no paresthesia  Additional Notes Patient identified. Risks/Benefits/Options discussed with patient including but not limited to bleeding, infection, nerve damage, paralysis, failed block, incomplete pain control, headache, blood pressure changes, nausea, vomiting, reactions to medication both or allergic, itching and postpartum back pain. Confirmed with bedside nurse the patient's most recent platelet count. Confirmed with patient that they are not currently taking any anticoagulation, have any bleeding history or any family history of bleeding disorders. Patient expressed understanding and wished to proceed. All questions were answered. Sterile technique was used throughout the entire procedure. Please see nursing notes for vital signs. Test dose was given through epidural catheter and negative prior to continuing to dose epidural or start infusion. Warning signs of high block given to the patient including shortness of breath, tingling/numbness in hands, complete motor block,  or any concerning symptoms with instructions to call for help. Patient was given instructions on fall risk and not to get out of bed. All questions and concerns addressed with instructions to call with any issues or inadequate analgesia.

## 2015-08-05 NOTE — Progress Notes (Signed)
Labor Progress  Subjective: Just received an epidural, feeling a little better  Objective: BP 107/60 mmHg  Pulse 92  Temp(Src) 97.8 F (36.6 C) (Oral)  Resp 20  Ht 5\' 3"  (1.6 m)  Wt 160 lb (72.576 kg)  BMI 28.35 kg/m2  SpO2 99%  LMP 12/08/2014     FHT: 140, moderate variability, + accel, no decel CTX:  regular, every 4-5 minutes Uterus gravid, soft non tender SVE:  Dilation: 8.5 Effacement (%): 80 Station: -1 Exam by:: Carol Rocha, CNM   Assessment:  IUP at 38.5 weeks NICHD: Category 1 Membranes:  SROM x 6.5hrs, no s/s of infection Labor progress: adquate labor GBS: negative   Plan: Continue labor plan Continuous monitoring Frequent position changes to facilitate fetal rotation and descent. Will reassess with cervical exam at 1700 or earlier if necessary      Carol Rocha, CNM, MSN 08/05/2015. 2:51 PM

## 2015-08-05 NOTE — Anesthesia Preprocedure Evaluation (Signed)
Anesthesia Evaluation  Patient identified by MRN, date of birth, ID band Patient awake    Reviewed: Allergy & Precautions, H&P , NPO status , Patient's Chart, lab work & pertinent test results  Airway Mallampati: II  TM Distance: >3 FB Neck ROM: full    Dental no notable dental hx. (+) Teeth Intact, Dental Advisory Given   Pulmonary neg pulmonary ROS,    Pulmonary exam normal breath sounds clear to auscultation       Cardiovascular Exercise Tolerance: Good negative cardio ROS Normal cardiovascular exam Rhythm:regular Rate:Normal     Neuro/Psych negative neurological ROS  negative psych ROS   GI/Hepatic negative GI ROS, Neg liver ROS, hiatal hernia, GERD  Medicated and Controlled,  Endo/Other  negative endocrine ROS  Renal/GU negative Renal ROS  negative genitourinary   Musculoskeletal   Abdominal   Peds  Hematology negative hematology ROS (+)   Anesthesia Other Findings   Reproductive/Obstetrics negative OB ROS (+) Pregnancy                             Anesthesia Physical Anesthesia Plan  ASA: II  Anesthesia Plan: Epidural   Post-op Pain Management:    Induction:   Airway Management Planned:   Additional Equipment:   Intra-op Plan:   Post-operative Plan:   Informed Consent: I have reviewed the patients History and Physical, chart, labs and discussed the procedure including the risks, benefits and alternatives for the proposed anesthesia with the patient or authorized representative who has indicated his/her understanding and acceptance.   Dental Advisory Given  Plan Discussed with: CRNA and Surgeon  Anesthesia Plan Comments:         Anesthesia Quick Evaluation

## 2015-08-05 NOTE — Progress Notes (Signed)
Carol Rocha MRN: [id number]  Subjective: -Care assumed of 30y.o. G1P0 at 38.5wks who presents for PROM at 0800 and contractions at 0900.  Patient is GBS negative and no significant PN history per HnP.  In room to meet acquaintance and evaluate.  Patient having some nausea and continues to deny feelings of pressure. Husband at bedside, supportive.   Objective: BP 121/58 mmHg  Pulse 101  Temp(Src) 100 F (37.8 C) (Oral)  Resp 20  Ht 5\' 3"  (1.6 m)  Wt 72.576 kg (160 lb)  BMI 28.35 kg/m2  SpO2 100%  LMP 12/08/2014      Fetal Monitoring: FHT: 145 bpm, Mod Var, -Decels, +Accels UC: Q2-553min, palpates moderate    Vaginal Exam: SVE:   Dilation: 10 Effacement (%): 100 Station: +3 Exam by::  Membranes:SROM x 12hrs Internal Monitors: None  Augmentation/Induction: Pitocin:None Cytotec: None  Assessment:  IUP at 38.5wks Cat I FT  2nd Stage Labor Febrile  Plan: -Attempted pushing with no success, patient with no perception of vaginal/rectal pressure despite fetal station and provider pressure -Will call anesthesiologist to decrease epidural as appropriate -Position change to promote fetal rotation and further descent -Continue other mgmt as ordered  Valma CavaJessica L Kaislyn Gulas,MSN, CNM 08/05/2015, 8:28 PM

## 2015-08-05 NOTE — MAU Note (Signed)
Report given to [employer] RN Charge BS received room assignment of [id number]

## 2015-08-05 NOTE — MAU Note (Signed)
Pt states water broke around 0800.  Pt began having contractions around 0900 that have gotten worse every 5 min

## 2015-08-06 ENCOUNTER — Encounter (HOSPITAL_COMMUNITY): Payer: Self-pay

## 2015-08-06 LAB — CBC
HEMATOCRIT: 30 % — AB (ref 36.0–46.0)
HEMOGLOBIN: 10.4 g/dL — AB (ref 12.0–15.0)
MCH: 31.4 pg (ref 26.0–34.0)
MCHC: 34.7 g/dL (ref 30.0–36.0)
MCV: 90.6 fL (ref 78.0–100.0)
Platelets: 271 10*3/uL (ref 150–400)
RBC: 3.31 MIL/uL — AB (ref 3.87–5.11)
RDW: 13.5 % (ref 11.5–15.5)
WBC: 28.8 10*3/uL — ABNORMAL HIGH (ref 4.0–10.5)

## 2015-08-06 LAB — RPR: RPR: NONREACTIVE

## 2015-08-06 MED ORDER — DOCUSATE SODIUM 100 MG PO CAPS
100.0000 mg | ORAL_CAPSULE | Freq: Two times a day (BID) | ORAL | Status: DC
  Administered 2015-08-06 – 2015-08-08 (×4): 100 mg via ORAL
  Filled 2015-08-06 (×4): qty 1

## 2015-08-06 MED ORDER — SENNOSIDES-DOCUSATE SODIUM 8.6-50 MG PO TABS
2.0000 | ORAL_TABLET | Freq: Once | ORAL | Status: AC
  Administered 2015-08-06: 2 via ORAL
  Filled 2015-08-06: qty 2

## 2015-08-06 MED ORDER — FAMOTIDINE 20 MG PO TABS: 10.0000 mg | ORAL_TABLET | Freq: Two times a day (BID) | ORAL | Status: DC

## 2015-08-06 MED ORDER — OXYCODONE-ACETAMINOPHEN 5-325 MG PO TABS
1.0000 | ORAL_TABLET | ORAL | Status: DC | PRN
  Administered 2015-08-06 – 2015-08-08 (×8): 1 via ORAL
  Filled 2015-08-06 (×8): qty 1

## 2015-08-06 MED ORDER — IBUPROFEN 600 MG PO TABS
600.0000 mg | ORAL_TABLET | Freq: Four times a day (QID) | ORAL | Status: DC
  Administered 2015-08-06 – 2015-08-08 (×9): 600 mg via ORAL
  Filled 2015-08-06 (×9): qty 1

## 2015-08-06 MED ORDER — DIBUCAINE 1 % RE OINT: 1.0000 "application " | TOPICAL_OINTMENT | RECTAL | Status: DC | PRN

## 2015-08-06 MED ORDER — ZOLPIDEM TARTRATE 5 MG PO TABS: 5.0000 mg | ORAL_TABLET | Freq: Every evening | ORAL | Status: DC | PRN

## 2015-08-06 MED ORDER — ONDANSETRON HCL 4 MG PO TABS: 4.0000 mg | ORAL_TABLET | ORAL | Status: DC | PRN

## 2015-08-06 MED ORDER — LANOLIN HYDROUS EX OINT
TOPICAL_OINTMENT | CUTANEOUS | Status: DC | PRN
Start: 2015-08-06 — End: 2015-08-08

## 2015-08-06 MED ORDER — OXYCODONE-ACETAMINOPHEN 5-325 MG PO TABS: 2.0000 | ORAL_TABLET | ORAL | Status: DC | PRN

## 2015-08-06 MED ORDER — WITCH HAZEL-GLYCERIN EX PADS: 1.0000 "application " | MEDICATED_PAD | CUTANEOUS | Status: DC | PRN

## 2015-08-06 MED ORDER — PRENATAL MULTIVITAMIN CH
1.0000 | ORAL_TABLET | Freq: Every day | ORAL | Status: DC
  Administered 2015-08-06 – 2015-08-07 (×2): 1 via ORAL
  Filled 2015-08-06 (×2): qty 1

## 2015-08-06 MED ORDER — FAMOTIDINE 20 MG PO TABS
10.0000 mg | ORAL_TABLET | Freq: Two times a day (BID) | ORAL | Status: DC
  Administered 2015-08-06 – 2015-08-08 (×5): 10 mg via ORAL
  Filled 2015-08-06 (×6): qty 1

## 2015-08-06 MED ORDER — ONDANSETRON HCL 4 MG/2ML IJ SOLN: 4.0000 mg | INTRAMUSCULAR | Status: DC | PRN

## 2015-08-06 MED ORDER — SIMETHICONE 80 MG PO CHEW: 80.0000 mg | CHEWABLE_TABLET | ORAL | Status: DC | PRN

## 2015-08-06 MED ORDER — DIPHENHYDRAMINE HCL 25 MG PO CAPS: 25.0000 mg | ORAL_CAPSULE | Freq: Four times a day (QID) | ORAL | Status: DC | PRN

## 2015-08-06 MED ORDER — TETANUS-DIPHTH-ACELL PERTUSSIS 5-2.5-18.5 LF-MCG/0.5 IM SUSP: 0.5000 mL | Freq: Once | INTRAMUSCULAR | Status: DC

## 2015-08-06 MED ORDER — FENTANYL CITRATE (PF) 100 MCG/2ML IJ SOLN
INTRAMUSCULAR | Status: AC
  Filled 2015-08-06: qty 2

## 2015-08-06 MED ORDER — FENTANYL CITRATE (PF) 100 MCG/2ML IJ SOLN
50.0000 ug | Freq: Once | INTRAMUSCULAR | Status: AC
Start: 2015-08-06 — End: 2015-08-06
  Administered 2015-08-06: 50 ug via INTRAVENOUS

## 2015-08-06 MED ORDER — ACETAMINOPHEN 325 MG PO TABS: 650.0000 mg | ORAL_TABLET | ORAL | Status: DC | PRN

## 2015-08-06 MED ORDER — BENZOCAINE-MENTHOL 20-0.5 % EX AERO
1.0000 "application " | INHALATION_SPRAY | CUTANEOUS | Status: DC | PRN
  Administered 2015-08-06: 1 via TOPICAL
  Filled 2015-08-06: qty 56

## 2015-08-06 NOTE — Progress Notes (Signed)
Subjective: Postpartum Day 0: Vaginal delivery, 4th degree laceration Patient up ad lib, reports no syncope or dizziness.  Has ambulated to BR, with spontaneous voiding Feeding:  Breast Contraceptive plan:  Condoms and Micronor  Objective: Vital signs in last 24 hours: Temp:  [97.8 F (36.6 C)-100 F (37.8 C)] 98.4 F (36.9 C) (03/06 0830) Pulse Rate:  [80-157] 80 (03/06 0830) Resp:  [16-98] 18 (03/06 0830) BP: (88-143)/(41-91) 123/70 mmHg (03/06 0830) SpO2:  [99 %-100 %] 100 % (03/05 1850) Weight:  [72.576 kg (160 lb)] 72.576 kg (160 lb) (03/05 1311)  Physical Exam:  General: alert Lochia: appropriate Uterine Fundus: firm Perineum: healing well DVT Evaluation: No evidence of DVT seen on physical exam. Negative Homan's sign.   CBC Latest Ref Rng 08/06/2015 08/05/2015 05/31/2014  WBC 4.0 - 10.5 K/uL 28.8(H) 15.3(H) -  Hemoglobin 12.0 - 15.0 g/dL 10.4(L) 14.0 12.9  Hematocrit 36.0 - 46.0 % 30.0(L) 40.0 38.0  Platelets 150 - 400 K/uL 271 297 -     Assessment/Plan: Status post vaginal delivery day 0. 4th degree laceration Leukocytosis without fever Stable Continue current care. Repeat CBC with diff in am Sitz bath after 24 hour of ice to perineum    Carol Rocha, Carol Rocha 08/06/2015, 9:19 AM

## 2015-08-06 NOTE — Anesthesia Postprocedure Evaluation (Signed)
Anesthesia Post Note  Patient: Carol Rocha  Procedure(s) Performed: * No procedures listed *  Patient location during evaluation: Mother Baby Anesthesia Type: Epidural Level of consciousness: awake and alert Pain management: pain level controlled Vital Signs Assessment: post-procedure vital signs reviewed and stable Respiratory status: spontaneous breathing, nonlabored ventilation and respiratory function stable Cardiovascular status: stable Postop Assessment: no headache, no backache and epidural receding Anesthetic complications: no    Last Vitals:  Filed Vitals:   08/06/15 0330 08/06/15 0437  BP: 129/64 121/67  Pulse: 88 87  Temp: 37.3 C 36.7 C  Resp: 16 16    Last Pain:  Filed Vitals:   08/06/15 0626  PainSc: 0-No pain                 [name]

## 2015-08-06 NOTE — Lactation Note (Signed)
This note was copied from a baby's chart. Lactation Consultation Note  P1, 10 hours old. Mother states she has attempted to latch but has been unable to sustain latch.  Carol RoundsSally RN offered to help but mother did not call. Reviewed hand expression and at this time was unable to express drops.  Helped mother but her breasts are very sensitive. Will need review of hand expression so mother can have review of mechanics.  FOB seems willing to help. Attempted latching in football hold but not comfortable to mother. Mother states it feels like he is biting. Oral assessment indicated strong suck and cupped tongue.  Reviewed suck training w/ parents and encouraged STS tummy time. Repositioned baby to cross cradle.  Baby latched easily but showed mother how to unlatched a few times to increase depth. Encouraged mother to massage/compress during feeding to keep him active.  Observed feeding for more than 10 min. Sucks and swallow observed.  LS7.  Reviewed cluster feeding, supply and demand and stomach size. Demonstrated how to perform chin tug once baby latches to ease mother's discomfort and widen latch. Discussed applying ebm and coconut oil for sore nipples. Mom encouraged to feed baby 8-12 times/24 hours and with feeding cues.  Mom made aware of O/P services, breastfeeding support groups, community resources, and our phone # for post-discharge questions.  Suggest parents call for help with feedings until they feel more comfortable  Patient Name: Carol Rocha ZOXWR'UToday's Date: 08/06/2015 Reason for consult: Initial assessment   Maternal Data Has patient been taught Hand Expression?: Yes Does the patient have breastfeeding experience prior to this delivery?: No  Feeding Feeding Type: Breast Fed Length of feed: 0 min  LATCH Score/Interventions Latch: Repeated attempts needed to sustain latch, nipple held in mouth throughout feeding, stimulation needed to elicit sucking reflex. Intervention(s):  Skin to skin;Teach feeding cues Intervention(s): Adjust position;Assist with latch;Breast massage  Audible Swallowing: A few with stimulation  Type of Nipple: Everted at rest and after stimulation  Comfort (Breast/Nipple): Soft / non-tender     Hold (Positioning): Assistance needed to correctly position infant at breast and maintain latch.  LATCH Score: 7  Lactation Tools Discussed/Used     Consult Status Consult Status: Follow-up Date: 08/07/15 Follow-up type: In-patient    Carol Rocha, Carol Rocha [facility] 08/06/2015, 11:37 AM

## 2015-08-07 LAB — CBC WITH DIFFERENTIAL/PLATELET
BASOS ABS: 0 10*3/uL (ref 0.0–0.1)
BASOS PCT: 0 %
EOS ABS: 0.4 10*3/uL (ref 0.0–0.7)
Eosinophils Relative: 2 %
HEMATOCRIT: 25.4 % — AB (ref 36.0–46.0)
HEMOGLOBIN: 8.7 g/dL — AB (ref 12.0–15.0)
Lymphocytes Relative: 22 %
Lymphs Abs: 4.3 10*3/uL — ABNORMAL HIGH (ref 0.7–4.0)
MCH: 31.5 pg (ref 26.0–34.0)
MCHC: 34.3 g/dL (ref 30.0–36.0)
MCV: 92 fL (ref 78.0–100.0)
MONO ABS: 2 10*3/uL — AB (ref 0.1–1.0)
Monocytes Relative: 10 %
NEUTROS ABS: 13.4 10*3/uL — AB (ref 1.7–7.7)
NEUTROS PCT: 67 %
Platelets: 245 10*3/uL (ref 150–400)
RBC: 2.76 MIL/uL — ABNORMAL LOW (ref 3.87–5.11)
RDW: 13.3 % (ref 11.5–15.5)
WBC: 20.1 10*3/uL — ABNORMAL HIGH (ref 4.0–10.5)

## 2015-08-07 MED ORDER — SENNOSIDES-DOCUSATE SODIUM 8.6-50 MG PO TABS
1.0000 | ORAL_TABLET | Freq: Every day | ORAL | Status: DC
  Administered 2015-08-07: 1 via ORAL
  Filled 2015-08-07: qty 1

## 2015-08-07 MED ORDER — SENNA 8.6 MG PO TABS
2.0000 | ORAL_TABLET | Freq: Every day | ORAL | Status: DC
  Filled 2015-08-07 (×2): qty 2

## 2015-08-07 MED ORDER — FERROUS SULFATE 325 (65 FE) MG PO TABS
325.0000 mg | ORAL_TABLET | Freq: Every day | ORAL | Status: DC
  Administered 2015-08-08: 325 mg via ORAL
  Filled 2015-08-07: qty 1

## 2015-08-07 NOTE — Lactation Note (Addendum)
This note was copied from a baby's chart. Lactation Consultation Note  Baby latched upon entering in laid back position and has been breastfeeding for almost 30 min. When unlatched nipple was slight compressed.  Encouraged mother to relatch during feeding if baby slips down on breast during feeding.  Suggest she also continue to practice hand expression. Mother seems to be doing well with breastfeeding. Discussed stool transitioning, stomach size, cluster feeding and comfort gels. Encouraged mother to call if she needs assistance.   Patient Name: Boy Avis Epleygustina Snooks ZOXWR'UToday's Date: 08/07/2015 Reason for consult: Follow-up assessment   Maternal Data    Feeding Feeding Type: Breast Fed Length of feed: 30 min  LATCH Score/Interventions Latch: Repeated attempts needed to sustain latch, nipple held in mouth throughout feeding, stimulation needed to elicit sucking reflex. Intervention(s): Skin to skin;Teach feeding cues;Waking techniques Intervention(s): Adjust position;Breast compression  Audible Swallowing: A few with stimulation Intervention(s): Skin to skin Intervention(s): Hand expression  Type of Nipple: Everted at rest and after stimulation  Comfort (Breast/Nipple): Soft / non-tender     Hold (Positioning): No assistance needed to correctly position infant at breast. Intervention(s): Breastfeeding basics reviewed;Support Pillows;Position options;Skin to skin  LATCH Score: 8  Lactation Tools Discussed/Used     Consult Status Consult Status: Follow-up Date: 08/08/15 Follow-up type: In-patient    Dahlia ByesBerkelhammer, Kayloni Rocco [facility] 08/07/2015, 9:03 AM

## 2015-08-07 NOTE — Progress Notes (Signed)
Subjective: Postpartum Day 1: Vaginal delivery, 4th degree laceration Patient up ad lib, reports no syncope, and one episode of dizziness. Experiencing perineal pain but well managed on Ibuprofen and Percocet Recently received sitz bath and shown how to use it  Has not had a bowel movement, nervous about it, and  is taking stool softners Feeding:  Breast  Contraceptive plan:  Pill and Condoms  Objective: Vital signs in last 24 hours: Temp:  [97.9 F (36.6 C)-98 F (36.7 C)] 97.9 F (36.6 C) (03/07 0655) Pulse Rate:  [75-88] 75 (03/07 0655) Resp:  [18] 18 (03/07 0655) BP: (107-114)/(65-73) 107/73 mmHg (03/07 16100655)  Physical Exam:  General: alert and cooperative Lochia: appropriate Uterine Fundus: firm Perineum: healing well DVT Evaluation: No evidence of DVT seen on physical exam. Negative Homan's sign.   CBC Latest Ref Rng 08/07/2015 08/06/2015 08/05/2015  WBC 4.0 - 10.5 K/uL 20.1(H) 28.8(H) 15.3(H)  Hemoglobin 12.0 - 15.0 g/dL 9.6(E8.7(L) 10.4(L) 14.0  Hematocrit 36.0 - 46.0 % 25.4(L) 30.0(L) 40.0  Platelets 150 - 400 K/uL 245 271 297     Assessment/Plan: Status post vaginal delivery day 1. Stable Breastfeeding  Anemia  Discussed constipation as potential side effect of narcotics and Fe  Encouraged to take stool softeners   Continue current care. Plan for discharge tomorrow   Beatrix FettersRachel StallCNM 08/07/2015, 12:03 PM

## 2015-08-08 MED ORDER — FERROUS SULFATE 325 (65 FE) MG PO TABS: 325.0000 mg | ORAL_TABLET | Freq: Every day | ORAL | Status: DC

## 2015-08-08 MED ORDER — IBUPROFEN 600 MG PO TABS: 600.0000 mg | ORAL_TABLET | Freq: Four times a day (QID) | ORAL | Status: DC | PRN

## 2015-08-08 MED ORDER — OXYCODONE-ACETAMINOPHEN 5-325 MG PO TABS: 1.0000 | ORAL_TABLET | ORAL | Status: DC | PRN

## 2015-08-08 NOTE — Lactation Note (Signed)
This note was copied from a baby's chart. Lactation Consultation Note Baby starting to cluster feed. BF much better w/good rhythm suckles at breast. Mom BF in cradle position. Hand expression to show mom colostrum. Mom wasn't sure if baby was getting enough or not. Hand expression taught demonstrating colostrum. Encouraged mom to feel breast before and after BF to assess breast for changes in breast. Cont. To write feedings, pees, and poops. Encouraged breast massage during BF to give baby more during BF. Mom c/o less nipple pain during latching. Mom states having less breast and nipple pain this morning. Baby had 8% weight loss in 48 hrs.  Patient Name: Boy Avis Epleygustina Barwick AVWUJ'WToday's Date: 08/08/2015 Reason for consult: Follow-up assessment;Breast/nipple pain   Maternal Data    Feeding Feeding Type: Breast Fed Length of feed: 40 min  LATCH Score/Interventions Latch: Grasps breast easily, tongue down, lips flanged, rhythmical sucking. Intervention(s): Skin to skin;Teach feeding cues;Waking techniques Intervention(s): Breast massage;Breast compression  Audible Swallowing: A few with stimulation  Type of Nipple: Everted at rest and after stimulation  Comfort (Breast/Nipple): Filling, red/small blisters or bruises, mild/mod discomfort  Problem noted: Mild/Moderate discomfort Interventions (Mild/moderate discomfort): Comfort gels;Hand massage;Hand expression  Hold (Positioning): No assistance needed to correctly position infant at breast. Intervention(s): Support Pillows;Position options;Skin to skin;Breastfeeding basics reviewed  LATCH Score: 8  Lactation Tools Discussed/Used Tools: Comfort gels   Consult Status Consult Status: Follow-up Date: 08/08/15 Follow-up type: In-patient    Charyl DancerCARVER, Treva Huyett G 08/08/2015, 5:16 AM

## 2015-08-08 NOTE — Discharge Instructions (Signed)
Postpartum Care After Vaginal Delivery After you deliver your newborn (postpartum period), the usual stay in the hospital is 24-72 hours. If there were problems with your labor or delivery, or if you have other medical problems, you might be in the hospital longer.  While you are in the hospital, you will receive help and instructions on how to care for yourself and your newborn during the postpartum period.  While you are in the hospital: 1. Be sure to tell your nurses if you have pain or discomfort, as well as where you feel the pain and what makes the pain worse. 2. If you had an incision made near your vagina (episiotomy) or if you had some tearing during delivery, the nurses may put ice packs on your episiotomy or tear. The ice packs may help to reduce the pain and swelling. 3. If you are breastfeeding, you may feel uncomfortable contractions of your uterus for a couple of weeks. This is normal. The contractions help your uterus get back to normal size. 4. It is normal to have some bleeding after delivery. 1. For the first 1-3 days after delivery, the flow is red and the amount may be similar to a period. 2. It is common for the flow to start and stop. 3. In the first few days, you may pass some small clots. Let your nurses know if you begin to pass large clots or your flow increases. 4. Do not  flush blood clots down the toilet before having the nurse look at them. 5. During the next 3-10 days after delivery, your flow should become more watery and pink or brown-tinged in color. 6. Ten to fourteen days after delivery, your flow should be a small amount of yellowish-white discharge. 7. The amount of your flow will decrease over the first few weeks after delivery. Your flow may stop in 6-8 weeks. Most women have had their flow stop by 12 weeks after delivery. 5. You should change your sanitary pads frequently. 6. Wash your hands thoroughly with soap and water for at least 20 seconds after  changing pads, using the toilet, or before holding or feeding your newborn. 7. You should feel like you need to empty your bladder within the first 6-8 hours after delivery. 8. In case you become weak, lightheaded, or faint, call your nurse before you get out of bed for the first time and before you take a shower for the first time. 9. Within the first few days after delivery, your breasts may begin to feel tender and full. This is called engorgement. Breast tenderness usually goes away within 48-72 hours after engorgement occurs. You may also notice milk leaking from your breasts. If you are not breastfeeding, do not stimulate your breasts. Breast stimulation can make your breasts produce more milk. 10. Spending as much time as possible with your newborn is very important. During this time, you and your newborn can feel close and get to know each other. Having your newborn stay in your room (rooming in) will help to strengthen the bond with your newborn. It will give you time to get to know your newborn and become comfortable caring for your newborn. 11. Your hormones change after delivery. Sometimes the hormone changes can temporarily cause you to feel sad or tearful. These feelings should not last more than a few days. If these feelings last longer than that, you should talk to your caregiver. 12. If desired, talk to your caregiver about methods of family planning or contraception.  13. Talk to your caregiver about immunizations. Your caregiver may want you to have the following immunizations before leaving the hospital: 1. Tetanus, diphtheria, and pertussis (Tdap) or tetanus and diphtheria (Td) immunization. It is very important that you and your family (including grandparents) or others caring for your newborn are up-to-date with the Tdap or Td immunizations. The Tdap or Td immunization can help protect your newborn from getting ill. 2. Rubella immunization. 3. Varicella (chickenpox)  immunization. 4. Influenza immunization. You should receive this annual immunization if you did not receive the immunization during your pregnancy.   This information is not intended to replace advice given to you by your health care provider. Make sure you discuss any questions you have with your health care provider.   Document Released: 03/16/2007 Document Revised: 02/11/2012 Document Reviewed: 01/14/2012 Elsevier Interactive Patient Education 2016 [employer]  Perineal Laceration A perineal laceration is a tear in the area of the vagina and/or rectum that occurs during the birth process. Tears can be intensely tender and interfere with normal activities of living. They can make sexual intercourse painful and bring on significant burning with urination. If you have a perineal laceration, the areas around your vagina and anus may be painful when you touch or wipe it. Even light pressure from clothing may cause some pain.   TREATMENT  Treatment depends on the severity of the tear. For minor tears that heal on their own, treatment may only consist of keeping the area clean and dry. Some tears need to be repaired with stitches. Other tears may heal on their own with help from various remedies, such as antibiotic ointments, medicated creams, or petroleum products. Depending on the circumstances, oral hormones may also be suggested. Hormone remedies may also be in the form of topical creams and vaginal tablets. For more concerning situations, hospitalization and surgical repair of the tear may be needed.  HOME CARE INSTRUCTIONS   Take warm-water baths that cover your hips and buttocks (sitz bath) 2 to 3 times a day. This may help any discomfort and swelling.   Only take over-the-counter or prescription medicines for pain, discomfort, or fever as directed by your caregiver. Do not use aspirin because it can cause increased bleeding.   Do not douche, use tampons, or have intercourse until your  caregiver says it is okay.   Apply ice or witch hazel pads to the vagina to lessen any pain or discomfort.   Take a stool softener or follow a special diet as directed by your caregiver. This will help ease discomfort associated with bowel movements.  SEEK IMMEDIATE MEDICAL CARE IF:   You have redness or swelling in the vaginal area.   You have increasing, sharp, or intense pain or tenderness in the vaginal area.  You have pus or unusual discharge coming from the tear or vagina.   You notice a bad smell coming from the vagina.   Your tear breaks open after it healed or was repaired.   You feel lightheaded.  You have increasing abdominal pain.   You have an increasing or heavy amount of vaginal bleeding.   You have pain with intercourse after the tear heals.  MAKE SURE YOU:  Understand these instructions.  Will watch your condition.  Will get help right away if you are not doing well or get worse.   This information is not intended to replace advice given to you by your health care provider. Make sure you discuss any questions you have with your  health care provider.   Document Released: 05/19/2005 Document Revised: 02/11/2012 Document Reviewed: 10/06/2011 Elsevier Interactive Patient Education [employer] Inc[employer]

## 2015-08-08 NOTE — Lactation Note (Signed)
This note was copied from a baby's chart. Lactation Consultation Note: Mother fed infant for 45 mins. She has a small crack on the left nipple. She is using comfort gels. Mother has expresses colostrum easily. Mother states that mother is breastfeeding well and is not biting as much. Advised mother to use good firm support and rotate positions frequently to prevent soreness. Mother advised mother that cluster feeding is normal and discussed cue base feeding. Mother was informed of treatment to prevent severe engorgement. Advised to continue to do frequent STS and feed infant 8-12 times/24 hours. Mother was given a hand pump and advised in hand expression or use of hand pump to pre pump to soften breast tissue when breast become full. Mother receptive to all breastfeeding teaching. Mother informed of all available LC services and community resources.   Patient Name: Carol Rocha [id number]'s Date: 08/08/2015 Reason for consult: Follow-up assessment   Maternal Data    Feeding    LATCH Score/Interventions                      Lactation Tools Discussed/Used     Consult Status Consult Status: Complete Date: 08/08/15    Stevan BornKendrick, [facility] 08/08/2015, 9:31 AM

## 2015-08-08 NOTE — Discharge Summary (Signed)
OB Discharge Summary     Patient Name: Carol Rocha DOB: 05-30-1986 MRN: [id number]  Date of admission: 08/05/2015 Delivering MD: Gerald LeitzOLE, TARA , and Gerrit HeckJessica Emly, CNM  Date of discharge: 08/08/2015  Admitting diagnosis: 38.5 WKS, IN LABOR Intrauterine pregnancy: 322w6d     Secondary diagnosis:  Principal Problem:   SVD (spontaneous vaginal delivery) Active Problems:   Normal labor   Fourth degree perineal laceration during delivery  Additional problems: Anemia due to acute blood loss      Discharge diagnosis: Term Pregnancy Delivered and 4th degree laceration, anemia                                                                                                Post partum procedures:None  Augmentation: Pitocin  Complications: Prolonged 2nd stage  Hospital course:  Onset of Labor With Vaginal Delivery     30 y.o. yo G1P1001 at 922w6d was admitted in Active Labor on 08/05/2015. Patient had an uncomplicated labor course as follows:  Membrane Rupture Time/Date: 8:00 AM ,08/05/2015   Intrapartum Procedures: Episiotomy: None [1]                                         Lacerations:  4th degree [5]  Patient had a delivery of a Viable infant.  She had a prolonged 2nd stage, likely due to dense epidural.  Epidural was finally turned off, and pitocin augmentation was begun.  Dr. Richardson Doppole was consulted for possible VE, but patient elected to continue pushing, and finally delivered vaginally.  4th degree laceration was noted, with Dr. Richardson Doppole performing the repair. 08/06/2015  Information for the patient's newborn:  Gustavus BryantKrebs, Boy Calee [[id number]][[id number]]  Delivery Method: Vaginal, Spontaneous Delivery (Filed from Delivery Summary)    Pateint had an uncomplicated postpartum course.  She is ambulating, tolerating a regular diet, passing flatus, and urinating well. Patient is discharged home in stable condition on 08/08/2015.  She is to f/u with [facility] in 1 week for MD evaluation of 4th degree perineal laceration.   She declined transfusion.  Leukocytosis without fever was noted on day 1 pp, with WBC noted to be decreasing by day 2.    Physical exam  Filed Vitals:   08/06/15 1650 08/07/15 0655 08/07/15 1905 08/08/15 0605  BP: 114/65 107/73 116/54 125/75  Pulse: 88 75 92 89  Temp: 98 F (36.7 C) 97.9 F (36.6 C) 98.4 F (36.9 C) 98.1 F (36.7 C)  TempSrc: Oral Oral Oral Oral  Resp: 18 18 18 18   Height:      Weight:      SpO2:    100%   General: alert Lochia: appropriate Uterine Fundus: firm Incision: 4th degree perineal laceration healing well. DVT Evaluation: No evidence of DVT seen on physical exam. Negative Homan's sign.  Orthostatics stable, patient declined transfusion  Labs: CBC Latest Ref Rng 08/07/2015 08/06/2015 08/05/2015  WBC 4.0 - 10.5 K/uL 20.1(H) 28.8(H) 15.3(H)  Hemoglobin 12.0 - 15.0 g/dL 7.8(G8.7(L) 10.4(L) 14.0  Hematocrit 36.0 -  46.0 % 25.4(L) 30.0(L) 40.0  Platelets 150 - 400 K/uL 245 271 297    CMP Latest Ref Rng 05/31/2014  Glucose 70 - 99 mg/dL 403(K)  BUN 6 - 23 mg/dL 17  Creatinine 7.42 - 5.95 mg/dL 6.38  Sodium 756 - 433 mmol/L 136  Potassium 3.5 - 5.1 mmol/L 3.9  Chloride 96 - 112 mEq/L 102    Discharge instruction: per After Visit Summary and "Baby and Me Booklet".  After visit meds:    Medication List    STOP taking these medications        HYDROcodone-acetaminophen 5-325 MG tablet  Commonly known as:  NORCO     levonorgestrel-ethinyl estradiol 0.1-20 MG-MCG tablet  Commonly known as:  AVIANE,ALESSE,LESSINA     ondansetron 4 MG disintegrating tablet  Commonly known as:  ZOFRAN-ODT      TAKE these medications        acetaminophen 500 MG tablet  Commonly known as:  TYLENOL  Take 500 mg by mouth every 6 (six) hours as needed for moderate pain.     ferrous sulfate 325 (65 FE) MG tablet  Take 1 tablet (325 mg total) by mouth daily with breakfast.     ibuprofen 600 MG tablet  Commonly known as:  ADVIL,MOTRIN  Take 1 tablet (600 mg total) by  mouth every 6 (six) hours as needed.     multivitamin-prenatal 27-0.8 MG Tabs tablet  Take 1 tablet by mouth daily at 12 noon.     oxyCODONE-acetaminophen 5-325 MG tablet  Commonly known as:  PERCOCET/ROXICET  Take 1 tablet by mouth every 4 (four) hours as needed (pain scale 4-7).     ranitidine 75 MG tablet  Commonly known as:  ZANTAC  Take 75 mg by mouth 2 (two) times daily.        Diet: routine diet  Activity: Advance as tolerated. Pelvic rest for 6 weeks.   Outpatient follow up:1 week at [facility] Follow up Appt:No future appointments. Follow up Visit:No Follow-up on file.  Postpartum contraception: Deciding between Micronor or condoms.  Newborn Data: Live born female  Birth Weight: 8 lb 3.4 oz (3725 g) APGAR: 9, 9  Baby Feeding: Breast Disposition:home with mother   08/08/2015 Nigel Bridgeman, CNM

## 2015-08-11 ENCOUNTER — Telehealth (HOSPITAL_COMMUNITY): Payer: Self-pay | Admitting: Lactation Services

## 2015-08-11 NOTE — Telephone Encounter (Signed)
Birth wt 8lb 4oz on 08/06/15, d/c wt on 08/08/15 was 7lb 6oz,  08/10/15 wt was 7lb 0oz. Mom reports bilateral nipple pain, R has a "small" crack and L is red "irritated". She is not pumping, has a manual at home. She feels like the baby is biting and it hurts when she takes him off. Baby will stay on the breast for 40 minutes. The Peds told her to limit baby to 20 minutes. Wets in 24 hr they can not tell, they know for sure he has had 1. BM in 24hr they think is 6 and they are runny yellow/green, sometimes mucus in the stool. Mom is latching baby every 2-3 hr around the clock. Peds told her to supplement after every feeding with 15ml of Simalac Aluminium.  Talked through laid back latching. Mom will bf baby on demand 8+/24hr, both breast actively sucking for at least 10 minutes on each side. F/U each bf with pumping/manual expression feed baby back whatever you get. She will offer 15-20 ml of formula if she does not have any of her milk available.

## 2015-08-14 ENCOUNTER — Ambulatory Visit (HOSPITAL_COMMUNITY)
Admission: RE | Admit: 2015-08-14 | Discharge: 2015-08-14 | Disposition: A | Discharge status: Home / Self Care | Payer: BLUE CROSS/BLUE SHIELD | Source: Ambulatory Visit | Attending: Obstetrics and Gynecology | Admitting: Obstetrics and Gynecology

## 2015-08-14 NOTE — Lactation Note (Signed)
Lactation Consult  Mother's reason for visit:  Weight loss Visit Type:  OP Appointment Notes:  Carol Rocha is 8 days of life and is 14 % below birthweight. He has been receiving 30 ml of formula after every feeding. Today an SNS was attempted to see if increased flow at the breast would increase his activity there. It did not. Mom reports that he becomes very sleepy at the breast. The BF was ended because he was not transferring. A special needs feeder was initiated in an attempt to increase his suckling effort. He easily transferred 60 ml on the easiest flow rate but anytime the flow rate was decreased he stopped suckling.  Post-pumping yielded 15 ml and post weight revealed that Carol Rocha only transferred 5 ml from the breast. HIs total intake was 65.  Oral evaluation reveals central tongue retraction when it is extended and tight intraoral tone. Tongue undulations were not felt. He elevates the sides of the tongue but not the center. The TMJ was massaged and his oral muscles were more relaxed but he did not do well keeping suction on a gloved finger.   PLAN: Goal increase milk supply and increase WInston's weight  Feed Carol Rocha every 2 hours from 8am-10pm Feed winston every 3 hours from 10 pm to 8 am this schedule will give him 10 feedings in 24 hours. Feed more often if he acts hungry Breast feed for 10 " to keep [facility] familiar with the breast but to conserve his energy Feed Winston 2-2.5 ml via special needs feeder after each breast feeding  Post-pump for 15 -20 minutes after each feeding Lactation appointment on Thursday   Consult:  Initial  Lactation Consultant:  Soyla DryerJoseph, Xzaviar Maloof  ________________________________________________________________________ Joan FloresBaby's Name: Carol Rocha Date of Birth: 08/06/2017 Pediatrician: Hosie PoissonSumner Gender: female Gestational Age: 2943w6d (At Birth) Birth Weight: 8 lb 3.4 oz (3725 g) Weight at Discharge: Weight: 7 lb 8.3 oz (3410 g)Date  of Discharge: 08/08/2015 Filed Weights   08/06/15 0116 08/07/15 0020 08/08/15 0100  Weight: 8 lb 3.4 oz (3725 g) 7 lb 12.3 oz (3525 g) 7 lb 8.3 oz (3410 g)   [facility]:  Location:  Weight today: 3204 7# 0.9 oz      ________________________________________________________________________  Mother's Name: Phyillis Milsap Type of delivery:  vaginal 4th degree Breastfeeding Experience:  First baby Maternal Medical Conditions:  hiatal hernia Maternal Medications:  Oxycodone,ibuprofen, PNV, stool softener, fenugreek  ________________________________________________________________________  Breastfeeding History (Post Discharge)  Frequency of breastfeeding:  Every 2.5 horus Duration of feeding:  30 minutes or more   Voids:  4-5 in 24 hrs.  Color:  medium yellow Stools:  5-6 in 24 hrs.  Color:  Brown, Yellow and watery  ________________________________________________________________________  Maternal Breast Assessment  Breast:  Soft Nipple:  Erect, Cracked and Scabs Pain level:  2 Pain interventions:  Comfort gels  _______________________________________________________________________

## 2015-08-15 ENCOUNTER — Telehealth (HOSPITAL_COMMUNITY): Payer: Self-pay

## 2015-08-15 NOTE — Telephone Encounter (Signed)
No answer

## 2015-08-16 ENCOUNTER — Ambulatory Visit (HOSPITAL_COMMUNITY): Payer: BLUE CROSS/BLUE SHIELD

## 2015-08-16 ENCOUNTER — Ambulatory Visit (HOSPITAL_COMMUNITY)
Admission: RE | Admit: 2015-08-16 | Discharge: 2015-08-16 | Disposition: A | Discharge status: Home / Self Care | Payer: BLUE CROSS/BLUE SHIELD | Source: Ambulatory Visit | Attending: Obstetrics & Gynecology | Admitting: Obstetrics & Gynecology

## 2015-08-16 NOTE — Lactation Note (Signed)
Lactation Consult  Mother's reason for visit:  Per mom Baby loosing weight and not sucking / latching poorly  Visit Type:  F/U feeding assessment due to significant weight loss ( 2nd visit this week in this office  Appointment Notes: Pt confirmed appt. For 3/16 at 4 pm and didn't want to change time to earlier when called  Consult:  Follow-Up Lactation Consultant:  Kathrin Greathouse  ________________________________________________________________________  Joan Flores Name: Ilean Skill Date of Birth: 08/06/2017 Pediatrician: Dr. Hosie Poisson - [facility]  Gender: female Gestational Age: [redacted]w[redacted]d (At Birth) Birth Weight: 8 lb 3.4 oz (3725 g) Weight at Discharge: Weight: 7 lb 8.3 oz (3410 g)Date of Discharge: 08/08/2015 Filed Weights   08/06/15 0116 08/07/15 0020 08/08/15 0100  Weight: 8 lb 3.4 oz (3725 g) 7 lb 12.3 oz (3525 g) 7 lb 8.3 oz (3410 g)   [facility]: 7.0.8 oz  Location: [facility] O/P  Weight today: 7-3.3 oz , 3268 g         _______________________________________________________________________  Mother's Name: Darcel Beckstead Type of delivery: Vaginal   Breastfeeding Experience:   1st baby  Maternal Medical Conditions:  No risk  Maternal Medications:  PNV   ________________________________________________________________________  Breastfeeding History (Post Discharge)  Frequency of breastfeeding: 2 plus hours  Duration of feeding:  15 mins on one side  Supplementing: per mom with a special feeder given to me at lactation visit  Breast milk and formula ( Similac )   Pumping: per mom with DEBP Symphony - with 20 ml  Both breast 20 mins of pumping   Infant Intake and Output Assessment  Voids:  Around 7  in 24 hrs.  Color:  Clear yellow and at consult - large wet changed by LC  Stools:  +/- 7 mustard  in 24 hrs.  Color:  Manson Passey and Yellow ( @ consult LC changed 3 loose yellow diapers)    ________________________________________________________________________  Maternal Breast Assessment  Breast:  Soft Nipple:  Erect Pain level:  0 Pain interventions:  Expressed breast milk  _______________________________________________________________________ Feeding Assessment/Evaluation  Initial feeding assessment: baby's color pink , mucous membranes moist   Infant's oral assessment:  Variance  Oral exam by LC with a gloved finger - noted a short labial frenulum above gum line , upper lip stretches well with exam and  At the breast when latched both breast. Indentation noted mid section of the anterior of the tongue , and when baby sucked  On gloved finger - intermittent humping of tongue. High palate, recessed chin.    Positioning:  Cross cradle Right breast  LATCH documentation:  Latch:  2 = Grasps breast easily, tongue down, lips flanged, rhythmical sucking.  Audible swallowing:  1 = A few with stimulation  Type of nipple:  2 = Everted at rest and after stimulation  Comfort (Breast/Nipple):  2 = Soft / non-tender  Hold (Positioning):  1 = Assistance needed to correctly position infant at breast and maintain latch  LATCH score: 9   Attached assessment:  Shallow @ 1st with support and easing chin depth obtained and mom comfortable  Lips flanged:  Yes.    Lips untucked:  Yes.    Suck assessment:  Nutritive and Nonnutritive ( more nutritive when 61F feeding tube added with EBM brought from home )   Tools:  Syringe with 5 Fr feeding tube Instructed on use and cleaning of tool:  Yes.    Pre-feed weight:  3268 g ,  7-3.3 oz  Post-feed weight:  3302 g , 7-4.5 oz  Amount transferred: 19 ml  Amount supplemented: 15 EBM / 37F SNS  Total at this breast = 34 ml   Additional Feeding Assessment -   Infant's oral assessment:  Variance ( see above note )   Positioning:  Football Left breast  LATCH documentation: Added 37F feeding tube shortly after the baby latched    Latch:  2 = Grasps breast easily, tongue down, lips flanged, rhythmical sucking.  Audible swallowing:  2 = Spontaneous and intermittent  Type of nipple:  2 = Everted at rest and after stimulation  Comfort (Breast/Nipple):  2 = Soft / non-tender  Hold (Positioning):  1 = Assistance needed to correctly position infant at breast and maintain latch  LATCH score:  9   Attached assessment:  Deep  Lips flanged:  Yes.    Lips untucked:  Yes.    Suck assessment:  Nutritive  Tools:  Syringe with 5 Fr feeding tube Instructed on use and cleaning of tool:  Yes.     After 3 stool diaper changes - re-weight   Pre-feed weight: 3278  g , 7-3.6 oz  Post-feed weight:  3318 g , 7-5.1 oz  Amount transferred:  16 ml  ( from mom )  Amount supplemented: 24 ml ( 37F SNS )  Total at this breast = 40 ml    Total amount pumped post feed:  15 ml ( total from both breast after feeding both breast   Total amount transferred:  35 ml  Total supplement given:  39 ml ( 37F SNS )  Total Volume for feeding: 74 ml ( baby tolerated well , small amount of spitting    Lactation Impression:  Baby has gained 3 plus ozs since last consult Tuesday  Oral Variance noted ( see above note)  Mom seems exhausted ( dark circles under her eyes) , also expressed those feelings  Baby sluggish on the 1st breast until the SNS was added and for the 2nd breast more alert  And did well with 37F SNS on both breast and supplementing with a bottle after wards not needed.  Milk supply is increasing compared to the consult 2 days ago by 35 ml . ( @ this consult latching and post pumping)  Per mom milk did initially came in 3 days after baby was born.  Per mom is taking Fenugreek - 1 capsule 3 times a day per Pedis suggestion - LC recommended to increase to 3 capsules 3x's a day for increased results. ( mom aware)  Mom 5 F feeding tube at the breast and has help at home to assist  [facility] felt due to non - nutritive feeding pattern at the breast  - 5 F feeding tube would benefit moms let down and increase milk supply. Also to give mom encouragement that breast feeding can work. Also most importantly keep baby "Nutritive " and participating at the breast.   Lactation Plan of care: Praised mom fore her efforts  Goal - continue to increase Winston weight and to increase milk supply with feeding at the breast with SNS and pumping.  MOM - rest , naps , plenty of fluids , especially water , nutritious snacks and meals  Feed Winston Days 2-21/2 hours , nights by 3 hours / skin to skin  Feed at the breast 20 mins with 5 F feeding tube ( with 2 12 ml filled Syringes as shown ) ( have dad or  moms sister help )  After breast feeding supplement with 45 - 60 ml with a bottle  Have dad or moms sister feed bottle ( 45-60 ml )  Mom to post pump 15 -20 mins  If mom is really tired and exhausted - feeding for Durwin Nora from a bottle  - 70-90 ml And mom just pumps both breast 15 -20 mins  \ Mom aware to call [facility] for a weight check Monday 3/20  F/U appt. Next Thursday 3/23 at 4 pm - LC O/P at [facility]

## 2015-08-23 ENCOUNTER — Ambulatory Visit (HOSPITAL_COMMUNITY)
Admission: RE | Admit: 2015-08-23 | Discharge: 2015-08-23 | Disposition: A | Discharge status: Home / Self Care | Payer: BLUE CROSS/BLUE SHIELD | Source: Ambulatory Visit | Attending: Obstetrics and Gynecology | Admitting: Obstetrics and Gynecology

## 2015-08-23 NOTE — Lactation Note (Signed)
Lactation Consult  Mother's reason for visit: slow weight gain Visit Type:  Feeding assessment Appointment Notes:  Slow weight gain Consult:  Follow-Up Lactation Consultant:  Huston FoleyMOULDEN, Dainel Arcidiacono S  ________________________________________________________________________  Joan FloresBaby's Name: Carol SkillWinston Turner Iiams Date of Birth: 08/06/2017 Pediatrician: Hosie Poissonsumner Gender: female Gestational Age: 2470w6d (At Birth) Birth Weight: 8 lb 3.4 oz (3725 g) Weight at Discharge: Weight: 7 lb 8.3 oz (3410 g)Date of Discharge: 08/08/2015 Filed Weights   08/06/15 0116 08/07/15 0020 08/08/15 0100  Weight: 8 lb 3.4 oz (3725 g) 7 lb 12.3 oz (3525 g) 7 lb 8.3 oz (3410 g)   [facility]: 7-3.3 0n 08/16/15 Location:Hospital Weight today: 7-7.5     ________________________________________________________________________  Mother's Name: Avis EpleyAgustina Sandefer Type of delivery:   Breastfeeding Experience: First baby   ________________________________________________________________________  Breastfeeding History (Post Discharge)  Frequency of breastfeeding:  Every 2-3 hours Duration of feeding:  15 minutes  Supplementation: Total 60-90 mls of expressed milk/formula every 3 hours.  Feeding taking one hour with special needs feeder.            Method:  Special Needs Feeder  Pumping  Type of pump:  Symphony Frequency:  6-8 times/24 hors Volume:  20-3250mlsl  Infant Intake and Output Assessment  Voids:  8 in 24 hrs.  Color:  Clear yellow Stools:  8 in 24 hrs.  Color:  Yellow  ________________________________________________________________________  Maternal Breast Assessment  Breast:  Soft Nipple:  Erect and Reddened    _______________________________________________________________________ Feeding Assessment/Evaluation Mom and 412 week old baby here for weight check and feeding assessment.  This baby initially had a 14 % weight loss and  gaining slowly.  Tight frenulum was noticed by previous LC'S and frenotomy done yesterday.  Mom states the feeding tube and syringe did not work at home and bottle feeding was taking one hour with a special feeder.  Mom looks exhausted and admits to feeling very tired.  Baby has gained 4 ounces/7 days.  Baby fed 1 1/2 hours prior to this consult.  Baby is very sleepy and showing little interest in feeding.  Baby initially latched to nipple only and mom states this is normal.  24 mm nipple shield applied to promote wider latch and possibly improve suckling.  This seemed to help briefly but baby fell asleep after 5 minutes.  Baby took bottle with slow flow nipple well.  Discussed with mom that baby should complete a feeding within about 20 minutes.  Goals are to increase weight gain to at least 1 ounce per day, increase mother's rest and milk supply.  Plan is to breastfeed Durwin NoraWinston with cues or at least every 3 hours, limit feeding at breast to 15 minutes, give 75-90 mls(more if desired) of expressed milk/formula using slow flow nipple/bottle, discontinue special feeder, "skip" a breastfeeding or two if too tired and only pump and bottle feed, breasts can go 4 hours during the night without pumping for more rest, call pediatrician tomorrow if unable to clean coating on tongue, follow yeast management for mom and baby.  Follow up appointment scheduled for 08/30/15.  Initial feeding assessment:  Infant's oral assessment:  Variance/Frenulum clipped yesterday and baby seems to have good tongue movement.  Thick white coating noted on tongue.  Positioning:  Cross cradle Right breast  LATCH documentation:  Latch:  0 = Too sleepy or reluctant, no latch achieved, no sucking elicited.  Audible swallowing:  0 = None  Type of nipple:  2 = Everted  at rest and after stimulation  Comfort (Breast/Nipple):  1 = Filling, red/small blisters or bruises, mild/mod discomfort  Hold (Positioning):  2 = No assistance needed to  correctly position infant at breast  LATCH score:  5  Attached assessment:  Shallow  Lips flanged:  No.  Lips untucked:  Yes.    Suck assessment:  Displays both  Tools:  Nipple shield 24 mm/Baby deeper with shield and more nutritive Instructed on use and cleaning of tool:  Yes.    Pre-feed weight:  3388 g  Post-feed weight:  3390 g Amount transferred:  2 ml Amount supplemented:  30 ml  Mom will give additional at home.  Similac slow flow nipple used.  Baby had a good seal and finished 30 mls in 5 minutes

## 2015-08-30 ENCOUNTER — Ambulatory Visit (HOSPITAL_COMMUNITY)
Admission: RE | Admit: 2015-08-30 | Discharge: 2015-08-30 | Disposition: A | Discharge status: Home / Self Care | Payer: BLUE CROSS/BLUE SHIELD | Source: Ambulatory Visit | Attending: Obstetrics & Gynecology | Admitting: Obstetrics & Gynecology

## 2015-09-02 NOTE — Lactation Note (Signed)
Lactation Consult  Mother's reason for visit: per mom F/U on Carol not latching properly and low milk supply  Visit Type: feeding assessment  Appointment Notes:  Low weight , appt. Confirmed for 3/30.  Consult:  Follow-Up Lactation Consultant:  Kathrin Greathouse  ________________________________________________________________________ Carol Rocha Name: Carol Rocha Date of Birth: 08/06/2017 Pediatrician: Dr. Aggie Hacker  Gender: female Gestational Age: [redacted]w[redacted]d (At Birth) Birth Weight: 8 lb 3.4 oz (3725 g) Weight at Discharge: Weight: 7 lb 8.3 oz (3410 g)Date of Discharge: 08/08/2015 Filed Weights   08/06/15 0116 08/07/15 0020 08/08/15 0100  Weight: 8 lb 3.4 oz (3725 g) 7 lb 12.3 oz (3525 g) 7 lb 8.3 oz (3410 g)   [facility]:7.7.[facility]  Location: [facility]  Weight today: 3686 g , 8-2.0 oz    ______________________________________________________________________  Mother's Name: Carol Rocha ________________________________________________________________________ Vaginal Delivery 4 degree  Medical risk - none  Medications- per mom Cipro ( antibiotic ) due to 4 degree infection -  Antibiotic for 10 days - and 6 days are left. Motrin and Percocet PRN for pain . Miralac, Colace  Breastfeeding History (Post Discharge)- Challenges due to low milk supply   Frequency of breastfeeding: 8 plus times day for 10 -15 mins one side  Duration of feeding: 10-15 mins  Infant Intake and Output Assessment  Voids:  +/- 10 in 24 hrs.  Color:  Clear yellow Stools:  +/- 8  in 24 hrs.  Color:  Brown and Yellow  ________________________________________________________________________  Maternal Breast Assessment  Breast:  Soft Nipple:  Erect Pain level:  0 Pain interventions:  Expressed breast milk  _______________________________________________________________________ Feeding Assessment/Evaluation  Initial feeding  assessment:  Infant's oral assessment:  Variance Frenotomy  In the last 2 weeks . Improved mobility of tongue noted   Positioning:  Cross cradle Right breast  LATCH documentation:  Latch:  2 = Grasps breast easily, tongue down, lips flanged, rhythmical sucking.  Audible swallowing:  2 = Spontaneous and intermittent  Type of nipple:  2 = Everted at rest and after stimulation  Comfort (Breast/Nipple):  2 = Soft / non-tender  Hold (Positioning):  2 = No assistance needed to correctly position infant at breast  LATCH score:  10  Attached assessment:  Deep  Lips flanged:  Yes.    Lips untucked:  Yes.    Suck assessment:  Nutritive and Nonnutritive  Tools:  None  Instructed on use and cleaning of tool:  No.  Pre-feed weight:  3686 g , 8-2.0 oz  Post-feed weight:  3708 g , 8-2.8 oz  Amount transferred:  22 ml  Amount supplemented:  None at this latch   Additional Feeding Assessment -   Infant's oral assessment:  Variance - see above note   Positioning:  Football Left breast  LATCH documentation:  Latch:  2 = Grasps breast easily, tongue down, lips flanged, rhythmical sucking.  Audible swallowing:  2 = Spontaneous and intermittent  Type of nipple:  2 = Everted at rest and after stimulation  Comfort (Breast/Nipple):  2 = Soft / non-tender  Hold (Positioning):  1 = Assistance needed to correctly position infant at breast and maintain latch  LATCH score:  9   Attached assessment:  Deep  Lips flanged:  Yes.    Lips untucked:  Yes.    Suck assessment:  Nutritive and Nonnutritive  Tools:  #24 NS  Instructed on use and cleaning of tool:  Yes   Pre-feed weight:  3708 g, 8-2.8 oz  Post-feed weight:  3712 g , 8-2.9 oz  Amount transferred:  4 ml  Amount supplemented: 60 ml formula formula ( LC fed abby while mom pumped)    Total amount pumped post feed:  R 4  ml    L 8 ml   Total amount transferred:  26 ml  Total supplement given: 60 ml  Total amount for feeding: 86 ml (  Carol tolerated well   Lactation Impression:  Carol Rocha is 3 weeks only  Weight has increased from last week 7-7.5 oz to 8-2.o oz today almost back to birth weight.  Milk supply is still an issue  Carol only transferred 26 ml at this consult  Carol transferred more volume off the breast without the NS and latched well without it .  Supplementation is a must with every feeding - 60 ml of formula given to Carol while mom post pumped 12 ml [employer] latches well , that is not the issue, the milk supply is the issue and the Carol intermittently is non - nutritive  ( hanging out at the breast). Per mom post pumping after every feeding except on time at night ( average volume obtained = 20 -60 ml ) at home.  Per mom is supplementing EBM and formula from a bottle. [facility], and mom felt it didn't work well for her.  Per mom supplementing 75-90 ml ( Similiac Alimentum ) or expressed milk if available after every feeding.  Mom seemed very tired today - LC stressed to mom when Carol is sleeping to take some naps.    Lactation Plan of Care:  Praised mom for her efforts  Encouraged mom to focus on what she can give her Carol breast milk wise and not on what she can't due to low milk supply  LC recommended to mom the BFSG Monday evenings at 7 pm or [facility]/ also for weight check Per mom Taking Fenugreek - LC recommended continuing it  Feedings- Skin to skin , work on latching without the NS , ( have NS as back up if needed) mom aware to use if needed Continue to feed 20 mins max at the breast and supplement with 75-90 ml after wards of EBM if available , otherwise formula . Or combination  Pump both breast for 10 -20 mins afterwards - save milk Option: Feed in football position while feeding pump the other breast while feeding to decrease on time for feeding. ( maybe to try this when dad is home) Extra pumping : Indicated at least 5-6 x's a day  Mom  aware Carol should not go over 3 hours to feed until gaining well.

## 2016-05-31 IMAGING — NM NM HEPATO W/GB/PHARM/[PERSON_NAME]
3 series · 13 of 13 positions shown · non-contrast
Comparison: Abdominal ultrasound July 27, 2014

CLINICAL DATA: Left upper quadrant pain with nausea and vomiting;
history of irritable bowel disease, symptom duration the past 2
years

EXAM:
NUCLEAR MEDICINE HEPATOBILIARY IMAGING WITH GALLBLADDER EF
TECHNIQUE: Sequential images of the abdomen were obtained [DATE] minutes
following intravenous administration of radiopharmaceutical. After
slow intravenous infusion of 1.09 micrograms Cholecystokinin,
gallbladder ejection fraction was determined.
RADIOPHARMACEUTICALS:  5.0 Millicurie 0c-33m Choletec

[he hepatobiliary · 3.43mm/px · 6 of 60 frames shown (1 of 3)]
[frame 6/60]
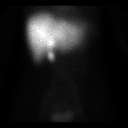
[frame 16/60]
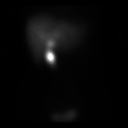
[frame 26/60]
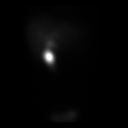
[frame 36/60]
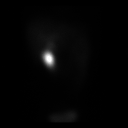
[frame 46/60]
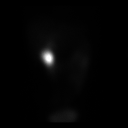
[frame 56/60]
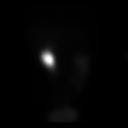

[he hepatobiliary · 3.43mm/px · 6 of 45 frames shown (2 of 3)]
[frame 4/45]
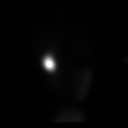
[frame 12/45]
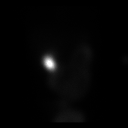
[frame 19/45]
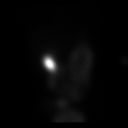
[frame 27/45]
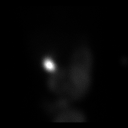
[frame 34/45]
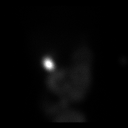
[frame 42/45]
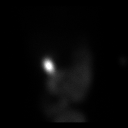

[he hepatobiliary · 1 of 1 slices shown (3 of 3)]
[im 1/1]
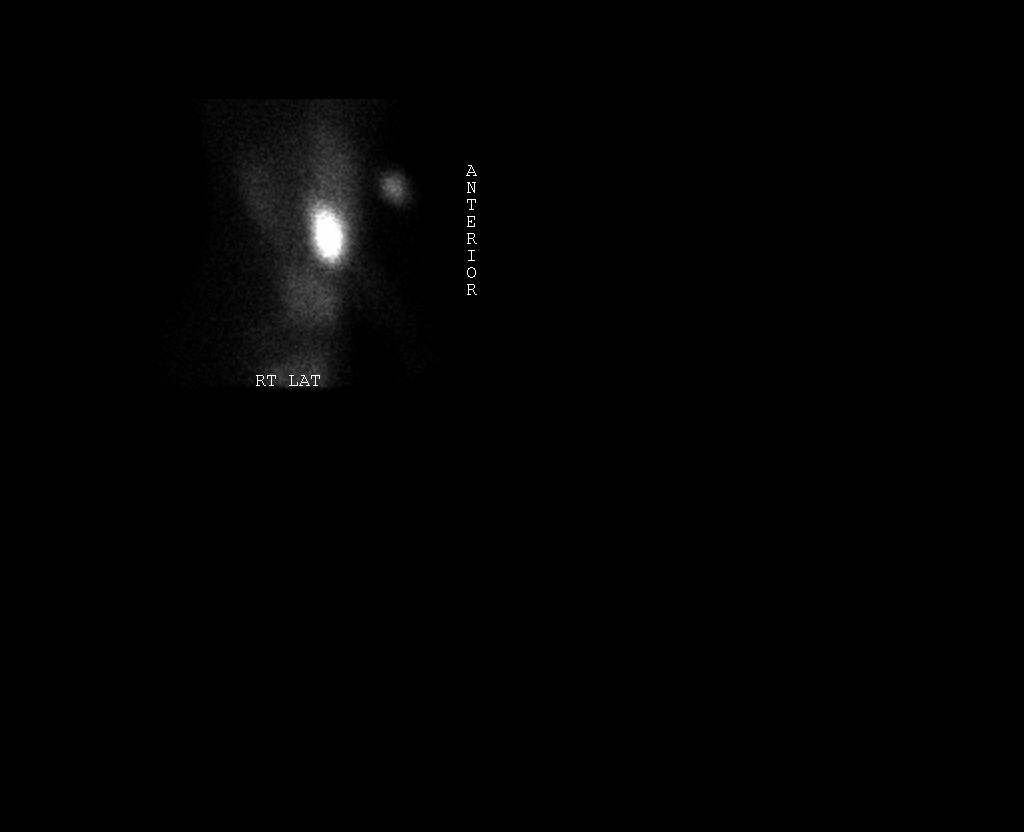

[13 of 13 positions shown; findings below may reference images not displayed]

FINDINGS: There is adequate uptake of the radiopharmaceutical by the liver.
The intrahepatic ducts and common bile duct and gallbladder are
visible by 10 minutes. Following infusion of CCK the 45 minute
ejection fraction was low normal at 45%.. At 45 min, normal ejection
fraction is greater than 40%.

The patient did not experience symptoms during CCK infusion.
IMPRESSION: There is no evidence of cystic duct obstruction. There the
gallbladder ejection fraction is near the lower limits of normal at
45%.

## 2016-10-30 DIAGNOSIS — J028 Acute pharyngitis due to other specified organisms: Secondary | ICD-10-CM | POA: Diagnosis not present

## 2017-01-26 ENCOUNTER — Ambulatory Visit (INDEPENDENT_AMBULATORY_CARE_PROVIDER_SITE_OTHER): Payer: 59 | Admitting: Podiatry

## 2017-01-26 ENCOUNTER — Encounter: Payer: Self-pay | Admitting: Podiatry

## 2017-01-26 VITALS — BP 119/76 | HR 109

## 2017-01-26 DIAGNOSIS — B351 Tinea unguium: Secondary | ICD-10-CM

## 2017-01-26 MED ORDER — TERBINAFINE HCL 250 MG PO TABS: 250.0000 mg | ORAL_TABLET | Freq: Every day | ORAL | 0 refills | Status: DC

## 2017-01-26 NOTE — Patient Instructions (Signed)

## 2017-01-26 NOTE — Progress Notes (Signed)
   Subjective:    Patient ID: Carol Rocha, female    DOB: [birth year], 31 y.o.   MRN: [id number]  HPI  Chief Complaint  Patient presents with  . Nail Problem    All nails B/L discolored and Big toenail Rt foot coming off       Review of Systems  Musculoskeletal: Positive for back pain.  Skin:       Change in nails  Neurological: Positive for headaches.  All other systems reviewed and are negative.      Objective:   Physical Exam        Assessment & Plan:

## 2017-01-26 NOTE — Progress Notes (Signed)
Subjective:    Patient ID: Carol Rocha, female   DOB: 31 y.o.   MRN: [id number]   HPI patient presents stating that she's had damage in her nailbeds and she's had chronic problems with thickness yellow numbness for at least 5 years. States that also her skin seems to be dry and irritated    Review of Systems  All other systems reviewed and are negative.       Objective:  Physical Exam  Constitutional: She appears well-developed and well-nourished.  Cardiovascular: Intact distal pulses.   Musculoskeletal: Normal range of motion.  Neurological: She is alert.  Skin: Skin is warm.  Nursing note and vitals reviewed.  neurovascular status intact muscle strength adequate range of motion within normal limits with patient noted to have thick yellow brittle nailbeds 1-5 both feet that are irritated with dry skin noted in the plantar aspect of both feet. Patient's found have good digital perfusion and is well oriented 3     Assessment:  Mycotic nail infection 1-5 both feet       Plan:    H&P conditions reviewed and explained. Due to long-standing nature I think would be best to go ahead and try to alleviate the fungal infection and she wants to go this route stating that it bothers her and also her skin bothers her. I discussed oral medicine discussing terbinafine and the risk associated with it and she wants to take it and we will have liver function studies done and she will take a 90 day course of treatment. I also recommended laser treatment with debridement of the nailbeds and topical treatment and she will be scheduled for this at the current time

## 2017-01-27 LAB — HEPATIC FUNCTION PANEL
ALBUMIN: 4.4 g/dL (ref 3.6–5.1)
ALK PHOS: 79 U/L (ref 33–115)
ALT: 14 U/L (ref 6–29)
AST: 15 U/L (ref 10–30)
BILIRUBIN TOTAL: 0.5 mg/dL (ref 0.2–1.2)
Bilirubin, Direct: 0.1 mg/dL (ref <=0.2)
Indirect Bilirubin: 0.4 mg/dL (ref 0.2–1.2)
Total Protein: 7 g/dL (ref 6.1–8.1)

## 2017-01-29 DIAGNOSIS — L0591 Pilonidal cyst without abscess: Secondary | ICD-10-CM | POA: Diagnosis not present

## 2017-01-30 ENCOUNTER — Telehealth: Payer: Self-pay | Admitting: *Deleted

## 2017-01-30 NOTE — Telephone Encounter (Signed)
[employer] [employer] [place] fax[place] notice that [employer] insurance would only cover #[id number] Lamisil. 01/30/2017-Left message informing pt if she would call with a [facility] pharmacy address I would call to the rx there and she could get it for between $4.00 and $12.00 without having to wait for the pre-cert.

## 2017-02-05 ENCOUNTER — Ambulatory Visit: Payer: 59

## 2017-02-10 DIAGNOSIS — L0591 Pilonidal cyst without abscess: Secondary | ICD-10-CM | POA: Diagnosis not present

## 2017-08-17 DIAGNOSIS — Z3201 Encounter for pregnancy test, result positive: Secondary | ICD-10-CM | POA: Diagnosis not present

## 2017-09-03 DIAGNOSIS — Z6821 Body mass index (BMI) 21.0-21.9, adult: Secondary | ICD-10-CM | POA: Diagnosis not present

## 2017-09-03 DIAGNOSIS — Z3201 Encounter for pregnancy test, result positive: Secondary | ICD-10-CM | POA: Diagnosis not present

## 2017-09-10 DIAGNOSIS — Z3481 Encounter for supervision of other normal pregnancy, first trimester: Secondary | ICD-10-CM | POA: Diagnosis not present

## 2017-09-10 LAB — OB RESULTS CONSOLE ABO/RH: RH TYPE: POSITIVE

## 2017-09-10 LAB — OB RESULTS CONSOLE HIV ANTIBODY (ROUTINE TESTING): HIV: NONREACTIVE

## 2017-09-10 LAB — OB RESULTS CONSOLE HEPATITIS B SURFACE ANTIGEN: HEP B S AG: NEGATIVE

## 2017-09-10 LAB — OB RESULTS CONSOLE RUBELLA ANTIBODY, IGM: Rubella: IMMUNE

## 2017-09-10 LAB — OB RESULTS CONSOLE GC/CHLAMYDIA
Chlamydia: NEGATIVE
Gonorrhea: NEGATIVE

## 2017-09-10 LAB — OB RESULTS CONSOLE RPR: RPR: NONREACTIVE

## 2017-09-10 LAB — OB RESULTS CONSOLE ANTIBODY SCREEN: Antibody Screen: NEGATIVE

## 2017-09-25 DIAGNOSIS — Z3491 Encounter for supervision of normal pregnancy, unspecified, first trimester: Secondary | ICD-10-CM | POA: Diagnosis not present

## 2017-10-07 ENCOUNTER — Inpatient Hospital Stay (HOSPITAL_COMMUNITY)
Admission: AD | Admit: 2017-10-07 | Discharge: 2017-10-07 | Disposition: A | Discharge status: Home / Self Care | Payer: 59 | Source: Ambulatory Visit | Attending: Obstetrics and Gynecology | Admitting: Obstetrics and Gynecology

## 2017-10-07 ENCOUNTER — Encounter (HOSPITAL_COMMUNITY): Payer: Self-pay

## 2017-10-07 DIAGNOSIS — M6208 Separation of muscle (nontraumatic), other site: Secondary | ICD-10-CM

## 2017-10-07 DIAGNOSIS — Z3A12 12 weeks gestation of pregnancy: Secondary | ICD-10-CM | POA: Insufficient documentation

## 2017-10-07 DIAGNOSIS — R1084 Generalized abdominal pain: Secondary | ICD-10-CM | POA: Diagnosis not present

## 2017-10-07 DIAGNOSIS — R100 Acute abdomen: Secondary | ICD-10-CM | POA: Diagnosis not present

## 2017-10-07 DIAGNOSIS — K219 Gastro-esophageal reflux disease without esophagitis: Secondary | ICD-10-CM | POA: Insufficient documentation

## 2017-10-07 DIAGNOSIS — R55 Syncope and collapse: Secondary | ICD-10-CM | POA: Diagnosis not present

## 2017-10-07 DIAGNOSIS — O9989 Other specified diseases and conditions complicating pregnancy, childbirth and the puerperium: Secondary | ICD-10-CM | POA: Diagnosis not present

## 2017-10-07 DIAGNOSIS — R109 Unspecified abdominal pain: Secondary | ICD-10-CM | POA: Insufficient documentation

## 2017-10-07 DIAGNOSIS — K429 Umbilical hernia without obstruction or gangrene: Secondary | ICD-10-CM | POA: Insufficient documentation

## 2017-10-07 DIAGNOSIS — O26891 Other specified pregnancy related conditions, first trimester: Secondary | ICD-10-CM | POA: Insufficient documentation

## 2017-10-07 DIAGNOSIS — O99611 Diseases of the digestive system complicating pregnancy, first trimester: Secondary | ICD-10-CM | POA: Insufficient documentation

## 2017-10-07 LAB — COMPREHENSIVE METABOLIC PANEL
ALBUMIN: 4 g/dL (ref 3.5–5.0)
ALT: 31 U/L (ref 14–54)
AST: 21 U/L (ref 15–41)
Alkaline Phosphatase: 63 U/L (ref 38–126)
Anion gap: 9 (ref 5–15)
BUN: 13 mg/dL (ref 6–20)
CHLORIDE: 105 mmol/L (ref 101–111)
CO2: 23 mmol/L (ref 22–32)
Calcium: 8.7 mg/dL — ABNORMAL LOW (ref 8.9–10.3)
Creatinine, Ser: 0.49 mg/dL (ref 0.44–1.00)
GFR calc Af Amer: >60 mL/min (ref >60)
GFR calc non Af Amer: >60 mL/min (ref >60)
GLUCOSE: 103 mg/dL — AB (ref 65–99)
POTASSIUM: 3.8 mmol/L (ref 3.5–5.1)
Sodium: 137 mmol/L (ref 135–145)
Total Bilirubin: 0.5 mg/dL (ref 0.3–1.2)
Total Protein: 6.9 g/dL (ref 6.5–8.1)

## 2017-10-07 LAB — CBC
HCT: 33.7 % — ABNORMAL LOW (ref 36.0–46.0)
Hemoglobin: 11.5 g/dL — ABNORMAL LOW (ref 12.0–15.0)
MCH: 30.4 pg (ref 26.0–34.0)
MCHC: 34.1 g/dL (ref 30.0–36.0)
MCV: 89.2 fL (ref 78.0–100.0)
PLATELETS: 292 10*3/uL (ref 150–400)
RBC: 3.78 MIL/uL — AB (ref 3.87–5.11)
RDW: 13.7 % (ref 11.5–15.5)
WBC: 11.3 10*3/uL — AB (ref 4.0–10.5)

## 2017-10-07 LAB — LIPASE, BLOOD: LIPASE: 22 U/L (ref 11–51)

## 2017-10-07 NOTE — MAU Provider Note (Signed)
Chief Complaint: Abdominal Pain and Loss of Consciousness   First Provider Initiated Contact with Patient 10/07/17 1430      SUBJECTIVE HPI: Carol Rocha is a 32 y.o. G2P1001 at 42w2dby LMP who [facility] with abdominal pain and 2 syncopal episodes today in the office.  She reports onset of intermittent umbilical pain yesterday, worsening today. The pain is squeezing cramping pain at her umbilicus only, that does not radiate.  Sitting down makes it worse, standing initially made it better but while in the office the pain became severe and occurred even when standing. She denies pain now in MAU.  She had nausea associated with her syncope but none now. She reports a hx of fainting with pain and other stress. "I am a fainter. My blood pressure drops and I faint" she reports. She has not tried any treatments. There are no other associated symptoms. She has hx of vaginal delivery of 8lb3oz baby at 323 weekswith previous pregnancy. This pregnancy has been uncomplicated.    HPI  Past Medical History:  Diagnosis Date  . Dysmenorrhea   . GERD (gastroesophageal reflux disease)   . Hiatal hernia   . History of chicken pox   . IBS (irritable bowel syndrome) dx-2015  . Migraine    Past Surgical History:  Procedure Laterality Date  . pilonidal cyst removal  09/2008  . WISDOM TOOTH EXTRACTION     Social History   Socioeconomic History  . Marital status: Married    Spouse name: Not on file  . Number of children: Not on file  . Years of education: Not on file  . Highest education level: Not on file  Occupational History  . Not on file  Social Needs  . Financial resource strain: Not on file  . Food insecurity:    Worry: Not on file    Inability: Not on file  . Transportation needs:    Medical: Not on file    Non-medical: Not on file  Tobacco Use  . Smoking status: Never Smoker  . Smokeless tobacco: Never Used  Substance and  Sexual Activity  . Alcohol use: No  . Drug use: No  . Sexual activity: Yes    Comment: lessina  Lifestyle  . Physical activity:    Days per week: Not on file    Minutes per session: Not on file  . Stress: Not on file  Relationships  . Social connections:    Talks on phone: Not on file    Gets together: Not on file    Attends religious service: Not on file    Active member of club or organization: Not on file    Attends meetings of clubs or organizations: Not on file    Relationship status: Not on file  . Intimate partner violence:    Fear of current or ex partner: Not on file    Emotionally abused: Not on file    Physically abused: Not on file    Forced sexual activity: Not on file  Other Topics Concern  . Not on file  Social History Narrative  . Not on file   No current facility-administered medications on file prior to encounter.    Current Outpatient Medications on File Prior to Encounter  Medication Sig Dispense Refill  . acetaminophen (TYLENOL) 500 MG tablet Take 500 mg by mouth every 6 (six) hours as needed for moderate pain.    . Prenatal Vit-Fe Fumarate-FA (PRENATAL MULTIVITAMIN)  TABS tablet Take 1 tablet by mouth daily at 12 noon.    . terbinafine (LAMISIL) 250 MG tablet Take 1 tablet (250 mg total) by mouth daily. (Patient not taking: Reported on 10/07/2017) 90 tablet 0   No Known Allergies  ROS:  Review of Systems  Constitutional: Negative for chills, fatigue and fever.  Respiratory: Negative for shortness of breath.   Cardiovascular: Negative for chest pain.  Gastrointestinal: Positive for abdominal pain. Negative for nausea and vomiting.  Genitourinary: Negative for difficulty urinating, dysuria, flank pain, pelvic pain, vaginal bleeding, vaginal discharge and vaginal pain.  Musculoskeletal: Negative for back pain.  Neurological: Negative for dizziness and headaches.  Psychiatric/Behavioral: Negative.      I have reviewed patient's Past Medical Hx,  Surgical Hx, Family Hx, Social Hx, medications and allergies.   Physical Exam   Patient Vitals for the past 24 hrs:  BP Temp Temp src Pulse Resp SpO2  10/07/17 1354 98/60 98.4 F (36.9 C) Oral 92 18 100 %   Constitutional: Well-developed, well-nourished female in no acute distress.  Cardiovascular: normal rate Respiratory: normal effort GI: Abd soft, mild tenderness at umbilicus. Pos BS x 4 MS: Extremities nontender, no edema, normal ROM, 2-3 cm separation of diastasis recti noted with some soft tissue pushing out of umbilicus with abdominal pressure, no discoloration and abdomen is soft despite pressure Neurologic: Alert and oriented x 4.  GU: Neg CVAT.  PELVIC EXAM: Deferred  FHT 155 by doppler  LAB RESULTS  Results for orders placed or performed during the hospital encounter of 10/07/17 (from the past 48 hour(s))  CBC     Status: Abnormal   Collection Time: 10/07/17  3:02 PM  Result Value Ref Range   WBC 11.3 (H) 4.0 - 10.5 K/uL   RBC 3.78 (L) 3.87 - 5.11 MIL/uL   Hemoglobin 11.5 (L) 12.0 - 15.0 g/dL   HCT 33.7 (L) 36.0 - 46.0 %   MCV 89.2 78.0 - 100.0 fL   MCH 30.4 26.0 - 34.0 pg   MCHC 34.1 30.0 - 36.0 g/dL   RDW 13.7 11.5 - 15.5 %   Platelets 292 150 - 400 K/uL    Comment: Performed at [facility], [address] [place], [place] [address]  Comprehensive metabolic panel     Status: Abnormal   Collection Time: 10/07/17  3:02 PM  Result Value Ref Range   Sodium 137 135 - 145 mmol/L   Potassium 3.8 3.5 - 5.1 mmol/L   Chloride 105 101 - 111 mmol/L   CO2 23 22 - 32 mmol/L   Glucose, Bld 103 (H) 65 - 99 mg/dL   BUN 13 6 - 20 mg/dL   Creatinine, Ser 0.49 0.44 - 1.00 mg/dL   Calcium 8.7 (L) 8.9 - 10.3 mg/dL   Total Protein 6.9 6.5 - 8.1 g/dL   Albumin 4.0 3.5 - 5.0 g/dL   AST 21 15 - 41 U/L   ALT 31 14 - 54 U/L   Alkaline Phosphatase 63 38 - 126 U/L   Total Bilirubin 0.5 0.3 - 1.2 mg/dL   GFR calc non Af Amer >60 >60 mL/min   GFR calc Af Amer >60 >60 mL/min     Comment: (NOTE) The eGFR has been calculated using the CKD EPI equation. This calculation has not been validated in all clinical situations. eGFR's persistently <60 mL/min signify possible Chronic Kidney Disease.    Anion gap 9 5 - 15    Comment: Performed at [facility], [place]  [address], [place], [place] [address]  Lipase, blood     Status: None   Collection Time: 10/07/17  3:02 PM  Result Value Ref Range   Lipase 22 11 - 51 U/L    Comment: Performed at [facility], [address] [place], [place] [address]      IMAGING  Limited [place] Date: 10/07/17 EDD : 04/19/18 based on Viability:  FHT detected CRL measurement c/w early [place] Subjectively normal amniotic fluid, FHR visualized and wnl, fetal movement noted   MAU Management/MDM: Ordered labs and reviewed results.  CBC, CMP wnl. EKG done by EMS prior to arrival is NSR, wnl.  Pt is well appearing now after syncopal episodes.  She denies pain in MAU. Pain location and occurrence with abdominal pressure with sitting makes it most likely r/t umbilical hernia.  No evidence of incarceration of bowel.  Consult Dr Ronita Hipps with assessment and findings.  Will conservatively manage for now, pt to f/u if pain continues or increase in syncopal episodes.  Keep scheduled appointments in the office.  Return to MAU for emergencies. Pt discharged with strict return precautions.  ASSESSMENT  1. Diastasis of rectus abdominis   2. Umbilical hernia without obstruction and without gangrene   3. Pregnancy with 12 completed weeks gestation    PLAN Discharge home    Fatima Blank Certified Nurse-Midwife 10/07/2017  2:38 PM

## 2017-10-07 NOTE — MAU Note (Signed)
Pt was at her Dr's office for several abdominal pain that has persisted around her umbulicus 8/10. While there she had two syncopal episodes. Office called EMS and patient transferred here.

## 2018-01-26 DIAGNOSIS — Z23 Encounter for immunization: Secondary | ICD-10-CM | POA: Diagnosis not present

## 2018-02-24 DIAGNOSIS — Z3A32 32 weeks gestation of pregnancy: Secondary | ICD-10-CM | POA: Diagnosis not present

## 2018-02-24 DIAGNOSIS — O43193 Other malformation of placenta, third trimester: Secondary | ICD-10-CM | POA: Diagnosis not present

## 2018-03-10 DIAGNOSIS — Z23 Encounter for immunization: Secondary | ICD-10-CM | POA: Diagnosis not present

## 2018-03-17 DIAGNOSIS — Z3685 Encounter for antenatal screening for Streptococcus B: Secondary | ICD-10-CM | POA: Diagnosis not present

## 2018-03-19 LAB — OB RESULTS CONSOLE GBS: GBS: POSITIVE

## 2018-03-26 ENCOUNTER — Other Ambulatory Visit: Payer: Self-pay | Admitting: Obstetrics and Gynecology

## 2018-03-30 ENCOUNTER — Encounter (HOSPITAL_COMMUNITY): Payer: Self-pay

## 2018-03-30 ENCOUNTER — Telehealth (HOSPITAL_COMMUNITY): Payer: Self-pay | Admitting: *Deleted

## 2018-03-30 NOTE — Telephone Encounter (Signed)
Preadmission screen  

## 2018-04-01 ENCOUNTER — Encounter (HOSPITAL_COMMUNITY): Payer: Self-pay

## 2018-04-09 DIAGNOSIS — Z3A38 38 weeks gestation of pregnancy: Secondary | ICD-10-CM | POA: Diagnosis not present

## 2018-04-09 DIAGNOSIS — O43193 Other malformation of placenta, third trimester: Secondary | ICD-10-CM | POA: Diagnosis not present

## 2018-04-13 ENCOUNTER — Encounter (HOSPITAL_COMMUNITY)
Admission: RE | Admit: 2018-04-13 | Discharge: 2018-04-13 | Disposition: A | Discharge status: Home / Self Care | Payer: 59 | Source: Ambulatory Visit | Attending: Obstetrics and Gynecology | Admitting: Obstetrics and Gynecology

## 2018-04-13 DIAGNOSIS — Z01812 Encounter for preprocedural laboratory examination: Secondary | ICD-10-CM | POA: Insufficient documentation

## 2018-04-13 LAB — TYPE AND SCREEN
ABO/RH(D): O POS
Antibody Screen: NEGATIVE

## 2018-04-13 LAB — CBC
HCT: 36.3 % (ref 36.0–46.0)
Hemoglobin: 12 g/dL (ref 12.0–15.0)
MCH: 30.1 pg (ref 26.0–34.0)
MCHC: 33.1 g/dL (ref 30.0–36.0)
MCV: 91 fL (ref 80.0–100.0)
NRBC: 0 % (ref 0.0–0.2)
PLATELETS: 285 10*3/uL (ref 150–400)
RBC: 3.99 MIL/uL (ref 3.87–5.11)
RDW: 13.1 % (ref 11.5–15.5)
WBC: 9.9 10*3/uL (ref 4.0–10.5)

## 2018-04-13 NOTE — Patient Instructions (Signed)
Keziah Thurner  04/13/2018   Your procedure is scheduled on:  04/14/2018  [facility] at 1100 AM.  Pick up the phone at the desk and dial [phone]  Call this number if you have problems the morning of surgery:[phone]  Remember:   Do not eat food:(After Midnight) Desps de medianoche.  Do not drink clear liquids: (After Midnight) Desps de medianoche.  Take these medicines the morning of surgery with A SIP OF WATER: none   Do not wear jewelry, make-up or nail polish.  Do not wear lotions, powders, or perfumes. Do not wear deodorant.  Do not shave 48 hours prior to surgery.  Do not bring valuables to the hospital.  [facility] is not   responsible for any belongings or valuables brought to the hospital.  Contacts, dentures or bridgework may not be worn into surgery.  Leave suitcase in the car. After surgery it may be brought to your room.  For patients admitted to the hospital, checkout time is 11:00 AM the day of              discharge.    N/A   Please read over the following fact sheets that you were given:   Surgical Site Infection Prevention

## 2018-04-14 ENCOUNTER — Inpatient Hospital Stay (HOSPITAL_COMMUNITY): Payer: 59 | Admitting: Anesthesiology

## 2018-04-14 ENCOUNTER — Inpatient Hospital Stay (HOSPITAL_COMMUNITY)
Admission: RE | Admit: 2018-04-14 | Discharge: 2018-04-17 | DRG: 788 | Disposition: A | Discharge status: Home / Self Care | Payer: 59 | Attending: Obstetrics and Gynecology | Admitting: Obstetrics and Gynecology

## 2018-04-14 ENCOUNTER — Encounter (HOSPITAL_COMMUNITY)
Admission: RE | Disposition: A | Discharge status: Home / Self Care | Payer: Self-pay | Source: Home / Self Care | Attending: Obstetrics and Gynecology

## 2018-04-14 ENCOUNTER — Encounter (HOSPITAL_COMMUNITY): Payer: Self-pay | Admitting: *Deleted

## 2018-04-14 DIAGNOSIS — Z8759 Personal history of other complications of pregnancy, childbirth and the puerperium: Secondary | ICD-10-CM | POA: Diagnosis not present

## 2018-04-14 DIAGNOSIS — O99824 Streptococcus B carrier state complicating childbirth: Secondary | ICD-10-CM | POA: Diagnosis present

## 2018-04-14 DIAGNOSIS — O26893 Other specified pregnancy related conditions, third trimester: Principal | ICD-10-CM | POA: Diagnosis present

## 2018-04-14 DIAGNOSIS — O9902 Anemia complicating childbirth: Secondary | ICD-10-CM | POA: Diagnosis present

## 2018-04-14 DIAGNOSIS — Z3A39 39 weeks gestation of pregnancy: Secondary | ICD-10-CM | POA: Diagnosis not present

## 2018-04-14 DIAGNOSIS — D649 Anemia, unspecified: Secondary | ICD-10-CM | POA: Diagnosis present

## 2018-04-14 DIAGNOSIS — O3473 Maternal care for abnormality of vulva and perineum, third trimester: Secondary | ICD-10-CM | POA: Diagnosis not present

## 2018-04-14 LAB — RPR: RPR Ser Ql: NONREACTIVE

## 2018-04-14 SURGERY — Surgical Case
Anesthesia: Spinal

## 2018-04-14 MED ORDER — BUPIVACAINE IN DEXTROSE 0.75-8.25 % IT SOLN
INTRATHECAL | Status: DC | PRN
  Administered 2018-04-14: 1.6 mL via INTRATHECAL

## 2018-04-14 MED ORDER — NALBUPHINE HCL 10 MG/ML IJ SOLN: 5.0000 mg | Freq: Once | INTRAMUSCULAR | Status: DC | PRN

## 2018-04-14 MED ORDER — DIPHENHYDRAMINE HCL 25 MG PO CAPS
25.0000 mg | ORAL_CAPSULE | ORAL | Status: DC | PRN
  Administered 2018-04-15: 25 mg via ORAL
  Filled 2018-04-14: qty 1

## 2018-04-14 MED ORDER — SIMETHICONE 80 MG PO CHEW
80.0000 mg | CHEWABLE_TABLET | ORAL | Status: DC
  Administered 2018-04-15 – 2018-04-16 (×3): 80 mg via ORAL
  Filled 2018-04-14 (×3): qty 1

## 2018-04-14 MED ORDER — SODIUM CHLORIDE 0.9% FLUSH: 3.0000 mL | INTRAVENOUS | Status: DC | PRN

## 2018-04-14 MED ORDER — TETANUS-DIPHTH-ACELL PERTUSSIS 5-2.5-18.5 LF-MCG/0.5 IM SUSP: 0.5000 mL | Freq: Once | INTRAMUSCULAR | Status: DC

## 2018-04-14 MED ORDER — SODIUM CHLORIDE 0.9 % IR SOLN
Status: DC | PRN
  Administered 2018-04-14: 1

## 2018-04-14 MED ORDER — FENTANYL CITRATE (PF) 100 MCG/2ML IJ SOLN
INTRAMUSCULAR | Status: AC
  Filled 2018-04-14: qty 2

## 2018-04-14 MED ORDER — SIMETHICONE 80 MG PO CHEW: 80.0000 mg | CHEWABLE_TABLET | ORAL | Status: DC | PRN

## 2018-04-14 MED ORDER — OXYTOCIN 40 UNITS IN LACTATED RINGERS INFUSION - SIMPLE MED: 2.5000 [IU]/h | INTRAVENOUS | Status: AC

## 2018-04-14 MED ORDER — DEXAMETHASONE SODIUM PHOSPHATE 4 MG/ML IJ SOLN
INTRAMUSCULAR | Status: AC
  Filled 2018-04-14: qty 1

## 2018-04-14 MED ORDER — BUPIVACAINE HCL (PF) 0.25 % IJ SOLN
INTRAMUSCULAR | Status: DC | PRN
  Administered 2018-04-14: 30 mL

## 2018-04-14 MED ORDER — FENTANYL CITRATE (PF) 100 MCG/2ML IJ SOLN
INTRAMUSCULAR | Status: DC | PRN
  Administered 2018-04-14: 15 ug via INTRATHECAL

## 2018-04-14 MED ORDER — ONDANSETRON HCL 4 MG/2ML IJ SOLN
INTRAMUSCULAR | Status: AC
  Filled 2018-04-14: qty 2

## 2018-04-14 MED ORDER — LACTATED RINGERS IV BOLUS
500.0000 mL | Freq: Once | INTRAVENOUS | Status: AC
  Administered 2018-04-14: 500 mL via INTRAVENOUS

## 2018-04-14 MED ORDER — ZOLPIDEM TARTRATE 5 MG PO TABS: 5.0000 mg | ORAL_TABLET | Freq: Every evening | ORAL | Status: DC | PRN

## 2018-04-14 MED ORDER — DIBUCAINE 1 % RE OINT: 1.0000 "application " | TOPICAL_OINTMENT | RECTAL | Status: DC | PRN

## 2018-04-14 MED ORDER — ACETAMINOPHEN 10 MG/ML IV SOLN
1000.0000 mg | Freq: Four times a day (QID) | INTRAVENOUS | Status: AC
  Administered 2018-04-14 – 2018-04-15 (×2): 1000 mg via INTRAVENOUS
  Filled 2018-04-14 (×3): qty 100

## 2018-04-14 MED ORDER — OXYTOCIN 10 UNIT/ML IJ SOLN
INTRAVENOUS | Status: DC | PRN
  Administered 2018-04-14: 40 [IU] via INTRAVENOUS

## 2018-04-14 MED ORDER — SCOPOLAMINE 1 MG/3DAYS TD PT72
MEDICATED_PATCH | TRANSDERMAL | Status: AC
  Filled 2018-04-14: qty 1

## 2018-04-14 MED ORDER — LACTATED RINGERS IV SOLN
INTRAVENOUS | Status: DC | PRN
  Administered 2018-04-14: 15:00:00 via INTRAVENOUS

## 2018-04-14 MED ORDER — ONDANSETRON HCL 4 MG/2ML IJ SOLN
INTRAMUSCULAR | Status: DC | PRN
  Administered 2018-04-14: 4 mg via INTRAVENOUS

## 2018-04-14 MED ORDER — IBUPROFEN 600 MG PO TABS
600.0000 mg | ORAL_TABLET | Freq: Four times a day (QID) | ORAL | Status: DC
  Administered 2018-04-15 – 2018-04-17 (×11): 600 mg via ORAL
  Filled 2018-04-14 (×13): qty 1

## 2018-04-14 MED ORDER — DIPHENHYDRAMINE HCL 25 MG PO CAPS
25.0000 mg | ORAL_CAPSULE | Freq: Four times a day (QID) | ORAL | Status: DC | PRN
  Filled 2018-04-14: qty 1

## 2018-04-14 MED ORDER — METHYLERGONOVINE MALEATE 0.2 MG/ML IJ SOLN: 0.2000 mg | INTRAMUSCULAR | Status: DC | PRN

## 2018-04-14 MED ORDER — DIPHENHYDRAMINE HCL 50 MG/ML IJ SOLN: 12.5000 mg | INTRAMUSCULAR | Status: DC | PRN

## 2018-04-14 MED ORDER — NALBUPHINE HCL 10 MG/ML IJ SOLN
5.0000 mg | INTRAMUSCULAR | Status: DC | PRN
  Administered 2018-04-15: 5 mg via INTRAVENOUS
  Filled 2018-04-14: qty 1

## 2018-04-14 MED ORDER — NALBUPHINE HCL 10 MG/ML IJ SOLN: 5.0000 mg | INTRAMUSCULAR | Status: DC | PRN

## 2018-04-14 MED ORDER — CEFAZOLIN SODIUM-DEXTROSE 2-4 GM/100ML-% IV SOLN
2.0000 g | INTRAVENOUS | Status: AC
  Administered 2018-04-14: 2 g via INTRAVENOUS
  Filled 2018-04-14: qty 100

## 2018-04-14 MED ORDER — SENNOSIDES-DOCUSATE SODIUM 8.6-50 MG PO TABS
2.0000 | ORAL_TABLET | ORAL | Status: DC
  Administered 2018-04-15 (×2): 2 via ORAL
  Filled 2018-04-14 (×2): qty 2

## 2018-04-14 MED ORDER — MENTHOL 3 MG MT LOZG: 1.0000 | LOZENGE | OROMUCOSAL | Status: DC | PRN

## 2018-04-14 MED ORDER — NALOXONE HCL 0.4 MG/ML IJ SOLN: 0.4000 mg | INTRAMUSCULAR | Status: DC | PRN

## 2018-04-14 MED ORDER — COCONUT OIL OIL
1.0000 "application " | TOPICAL_OIL | Status: DC | PRN
  Administered 2018-04-16: 1 via TOPICAL
  Filled 2018-04-14: qty 120

## 2018-04-14 MED ORDER — ONDANSETRON HCL 4 MG/2ML IJ SOLN: 4.0000 mg | Freq: Three times a day (TID) | INTRAMUSCULAR | Status: DC | PRN

## 2018-04-14 MED ORDER — NALOXONE HCL 4 MG/10ML IJ SOLN
1.0000 ug/kg/h | INTRAVENOUS | Status: DC | PRN
  Filled 2018-04-14: qty 5

## 2018-04-14 MED ORDER — BUPIVACAINE HCL (PF) 0.25 % IJ SOLN
INTRAMUSCULAR | Status: AC
  Filled 2018-04-14: qty 30

## 2018-04-14 MED ORDER — SIMETHICONE 80 MG PO CHEW
80.0000 mg | CHEWABLE_TABLET | Freq: Three times a day (TID) | ORAL | Status: DC
  Administered 2018-04-15 – 2018-04-17 (×5): 80 mg via ORAL
  Filled 2018-04-14 (×6): qty 1

## 2018-04-14 MED ORDER — EPHEDRINE SULFATE 50 MG/ML IJ SOLN
INTRAMUSCULAR | Status: DC | PRN
  Administered 2018-04-14: 10 mg via INTRAVENOUS

## 2018-04-14 MED ORDER — HYDROMORPHONE HCL 1 MG/ML IJ SOLN
0.5000 mg | Freq: Once | INTRAMUSCULAR | Status: AC
  Administered 2018-04-14: 0.5 mg via INTRAVENOUS
  Filled 2018-04-14: qty 1

## 2018-04-14 MED ORDER — WITCH HAZEL-GLYCERIN EX PADS: 1.0000 "application " | MEDICATED_PAD | CUTANEOUS | Status: DC | PRN

## 2018-04-14 MED ORDER — SCOPOLAMINE 1 MG/3DAYS TD PT72
1.0000 | MEDICATED_PATCH | Freq: Once | TRANSDERMAL | Status: AC
  Administered 2018-04-14: 1.5 mg via TRANSDERMAL

## 2018-04-14 MED ORDER — ACETAMINOPHEN 325 MG PO TABS
650.0000 mg | ORAL_TABLET | ORAL | Status: DC | PRN
  Administered 2018-04-15 – 2018-04-17 (×7): 650 mg via ORAL
  Filled 2018-04-14 (×6): qty 2

## 2018-04-14 MED ORDER — KETOROLAC TROMETHAMINE 30 MG/ML IJ SOLN
30.0000 mg | Freq: Once | INTRAMUSCULAR | Status: AC
  Administered 2018-04-14: 30 mg via INTRAVENOUS
  Filled 2018-04-14: qty 1

## 2018-04-14 MED ORDER — OXYCODONE-ACETAMINOPHEN 5-325 MG PO TABS
1.0000 | ORAL_TABLET | ORAL | Status: DC | PRN
  Administered 2018-04-15 (×3): 1 via ORAL
  Filled 2018-04-14 (×4): qty 1

## 2018-04-14 MED ORDER — MORPHINE SULFATE (PF) 0.5 MG/ML IJ SOLN
INTRAMUSCULAR | Status: AC
  Filled 2018-04-14: qty 10

## 2018-04-14 MED ORDER — OXYCODONE-ACETAMINOPHEN 5-325 MG PO TABS: 2.0000 | ORAL_TABLET | ORAL | Status: DC | PRN

## 2018-04-14 MED ORDER — OXYTOCIN 10 UNIT/ML IJ SOLN
INTRAMUSCULAR | Status: AC
  Filled 2018-04-14: qty 4

## 2018-04-14 MED ORDER — LACTATED RINGERS IV SOLN
INTRAVENOUS | Status: DC
  Administered 2018-04-14 (×2): via INTRAVENOUS

## 2018-04-14 MED ORDER — LACTATED RINGERS IV SOLN
INTRAVENOUS | Status: DC
  Administered 2018-04-14 – 2018-04-15 (×2): via INTRAVENOUS

## 2018-04-14 MED ORDER — MEPERIDINE HCL 25 MG/ML IJ SOLN: 6.2500 mg | INTRAMUSCULAR | Status: DC | PRN

## 2018-04-14 MED ORDER — PHENYLEPHRINE 8 MG IN D5W 100 ML (0.08MG/ML) PREMIX OPTIME
INJECTION | INTRAVENOUS | Status: DC | PRN
  Administered 2018-04-14: 60 ug/min via INTRAVENOUS

## 2018-04-14 MED ORDER — METHYLERGONOVINE MALEATE 0.2 MG PO TABS: 0.2000 mg | ORAL_TABLET | ORAL | Status: DC | PRN

## 2018-04-14 MED ORDER — PRENATAL MULTIVITAMIN CH
1.0000 | ORAL_TABLET | Freq: Every day | ORAL | Status: DC
  Administered 2018-04-15 – 2018-04-17 (×3): 1 via ORAL
  Filled 2018-04-14 (×3): qty 1

## 2018-04-14 MED ORDER — MORPHINE SULFATE (PF) 0.5 MG/ML IJ SOLN
INTRAMUSCULAR | Status: DC | PRN
  Administered 2018-04-14: .15 mg via INTRATHECAL

## 2018-04-14 MED ORDER — ACETAMINOPHEN 10 MG/ML IV SOLN
INTRAVENOUS | Status: AC
  Filled 2018-04-14: qty 100

## 2018-04-14 SURGICAL SUPPLY — 36 items
CHLORAPREP W/TINT 26ML (MISCELLANEOUS) ×3 IMPLANT
CLAMP CORD UMBIL (MISCELLANEOUS) IMPLANT
CLOTH BEACON ORANGE TIMEOUT ST (SAFETY) ×3 IMPLANT
DERMABOND ADVANCED (GAUZE/BANDAGES/DRESSINGS) ×2
DERMABOND ADVANCED .7 DNX12 (GAUZE/BANDAGES/DRESSINGS) ×1 IMPLANT
DRSG OPSITE POSTOP 4X10 (GAUZE/BANDAGES/DRESSINGS) ×3 IMPLANT
ELECT REM PT RETURN 9FT ADLT (ELECTROSURGICAL) ×3
ELECTRODE REM PT RTRN 9FT ADLT (ELECTROSURGICAL) ×1 IMPLANT
EXTRACTOR VACUUM M CUP 4 TUBE (SUCTIONS) IMPLANT
EXTRACTOR VACUUM M CUP 4' TUBE (SUCTIONS)
GLOVE BIO SURGEON STRL SZ7.5 (GLOVE) ×3 IMPLANT
GLOVE BIOGEL PI IND STRL 7.0 (GLOVE) ×1 IMPLANT
GLOVE BIOGEL PI INDICATOR 7.0 (GLOVE) ×2
GOWN STRL REUS W/TWL LRG LVL3 (GOWN DISPOSABLE) ×6 IMPLANT
KIT ABG SYR 3ML LUER SLIP (SYRINGE) IMPLANT
NEEDLE HYPO 22GX1.5 SAFETY (NEEDLE) ×3 IMPLANT
NEEDLE HYPO 25X5/8 SAFETYGLIDE (NEEDLE) IMPLANT
NEEDLE SPNL 20GX3.5 QUINCKE YW (NEEDLE) IMPLANT
NS IRRIG 1000ML POUR BTL (IV SOLUTION) ×3 IMPLANT
PACK C SECTION WH (CUSTOM PROCEDURE TRAY) ×3 IMPLANT
PENCIL SMOKE EVAC W/HOLSTER (ELECTROSURGICAL) ×3 IMPLANT
SPONGE LAP 18X18 RF (DISPOSABLE) ×9 IMPLANT
SUT MNCRL 0 VIOLET CTX 36 (SUTURE) ×2 IMPLANT
SUT MNCRL AB 3-0 PS2 27 (SUTURE) IMPLANT
SUT MON AB 2-0 CT1 27 (SUTURE) ×3 IMPLANT
SUT MON AB-0 CT1 36 (SUTURE) ×6 IMPLANT
SUT MONOCRYL 0 CTX 36 (SUTURE) ×4
SUT PLAIN 0 NONE (SUTURE) IMPLANT
SUT PLAIN 2 0 (SUTURE)
SUT PLAIN 2 0 XLH (SUTURE) IMPLANT
SUT PLAIN ABS 2-0 CT1 27XMFL (SUTURE) IMPLANT
SUT VIC AB 4-0 KS 27 (SUTURE) ×3 IMPLANT
SYR 20CC LL (SYRINGE) IMPLANT
SYR CONTROL 10ML LL (SYRINGE) ×3 IMPLANT
TOWEL OR 17X24 6PK STRL BLUE (TOWEL DISPOSABLE) ×3 IMPLANT
TRAY FOLEY W/BAG SLVR 14FR LF (SET/KITS/TRAYS/PACK) ×3 IMPLANT

## 2018-04-14 NOTE — Anesthesia Postprocedure Evaluation (Signed)
Anesthesia Post Note  Patient: Carol Rocha  Procedure(s) Performed: Primary CESAREAN SECTION (N/A )     Patient location during evaluation: PACU Anesthesia Type: Spinal Level of consciousness: oriented and awake and alert Pain management: pain level controlled Vital Signs Assessment: post-procedure vital signs reviewed and stable Respiratory status: spontaneous breathing, respiratory function stable and patient connected to nasal cannula oxygen Cardiovascular status: blood pressure returned to baseline and stable Postop Assessment: no headache, no backache and no apparent nausea or vomiting Anesthetic complications: no    Last Vitals:  Vitals:   04/14/18 1939 04/14/18 1946  BP: (!) 102/47 102/62  Pulse: 92 92  Resp:  18  Temp:    SpO2: 99% 100%    Last Pain:  Vitals:   04/14/18 1945  TempSrc:   PainSc: 4    Pain Goal: Patients Stated Pain Goal: 4 (04/14/18 1150)               Vianne Grieshop L Tequan Redmon

## 2018-04-14 NOTE — H&P (Signed)
Carol Rocha is a 32 y.o. female presenting for PCS for history of 4th degree laceration. Pt concerned about recurrence and unknown possibility of fecal incontinence.  OB History    Gravida  2   Para  1   Term  1   Preterm  0   AB  0   Living  1     SAB  0   TAB  0   Ectopic  0   Multiple  0   Live Births  1          Past Medical History:  Diagnosis Date  . Dysmenorrhea   . GERD (gastroesophageal reflux disease)   . Hiatal hernia   . History of chicken pox   . IBS (irritable bowel syndrome) dx-2015  . Migraine    Past Surgical History:  Procedure Laterality Date  . ANAL SPHINCTEROPLASTY    . pilonidal cyst removal  09/2008  . WISDOM TOOTH EXTRACTION     Family History: family history includes Breast cancer in her maternal grandmother; COPD in her father; Hypertension in her father; Hypothyroidism in her sister; Pancreatic cancer in her mother; Varicose Veins in her father and mother. Social History:  reports that she has never smoked. She has never used smokeless tobacco. She reports that she does not drink alcohol or use drugs.     Maternal Diabetes: No Genetic Screening: Normal Maternal Ultrasounds/Referrals: Normal Fetal Ultrasounds or other Referrals:  None Maternal Substance Abuse:  No Significant Maternal Medications:  None Significant Maternal Lab Results:  None Other Comments:  None  Review of Systems  Constitutional: Negative.   All other systems reviewed and are negative.  Maternal Medical History:  Contractions: Onset was less than 1 hour ago.   Frequency: rare.    Fetal activity: Perceived fetal activity is normal.   Last perceived fetal movement was within the past hour.    Prenatal complications: Placental abnormality.   Prenatal Complications - Diabetes: none.      Blood pressure 132/80, pulse (!) 114, temperature 98.3 F (36.8 C), temperature source Oral, resp. rate 18, height 5\' 4"  (1.626 m), weight 74.7 kg, unknown if  currently breastfeeding. Maternal Exam:  Uterine Assessment: Contraction strength is mild.  Contraction frequency is rare.   Abdomen: Patient reports no abdominal tenderness. Fetal presentation: vertex  Introitus: Normal vulva. Normal vagina.  Ferning test: not done.  Nitrazine test: not done. Amniotic fluid character: not assessed.  Pelvis: questionable for delivery.   Cervix: Cervix evaluated by digital exam.     Physical Exam  Nursing note and vitals reviewed. Constitutional: She is oriented to person, place, and time. She appears well-developed and well-nourished.  Neck: Normal range of motion. Neck supple.  Cardiovascular: Normal rate and regular rhythm.  Respiratory: Effort normal and breath sounds normal.  GI: Bowel sounds are normal.  Genitourinary: Vagina normal and uterus normal.  Musculoskeletal: Normal range of motion.  Neurological: She is alert and oriented to person, place, and time. She has normal reflexes.  Skin: Skin is warm and dry.  Psychiatric: She has a normal mood and affect.    Prenatal labs: ABO, Rh: --/--/O POS (11/12 1045) Antibody: NEG (11/12 1045) Rubella: Immune (04/11 0000) RPR: Non Reactive (11/12 1045)  HBsAg: Negative (04/11 0000)  HIV: Non-reactive (04/11 0000)  GBS: Positive (10/18 0000)   Assessment/Plan: 39 week IUP History of Fourth degree laceration Primary csection. Risks vs benefits of surgery discussed.  Risks of csection vs vaginal delivery noted. Risks of fecal  incontinence with recurrent 4th degree laceration noted. Consent signed . Pt desires to proceed.    Laker Thompson J 04/14/2018, 1:27 PM

## 2018-04-14 NOTE — Op Note (Signed)
Cesarean Section Procedure Note  Indications: Previous 4th degree laceration  Pre-operative Diagnosis: 39 week 2 day pregnancy.  Post-operative Diagnosis: same  Surgeon: Lenoard AdenAAVON,Zadia Uhde J   Assistants: Idelle JoSigmon, CNM  Anesthesia: Local anesthesia 0.25.% bupivacaine and Spinal anesthesia  ASA Class: 2  Procedure Details  The patient was seen in the Holding Room. The risks, benefits, complications, treatment options, and expected outcomes were discussed with the patient.  The patient concurred with the proposed plan, giving informed consent. The risks of anesthesia, infection, bleeding and possible injury to other organs discussed. Injury to bowel, bladder, or ureter with possible need for repair discussed. Possible need for transfusion with secondary risks of hepatitis or HIV acquisition discussed. Post operative complications to include but not limited to DVT, PE and Pneumonia noted. The site of surgery properly noted/marked. The patient was taken to Operating Room # 1, identified as Carol Rocha and the procedure verified as C-Section Delivery. A Time Out was held and the above information confirmed.  After induction of anesthesia, the patient was draped and prepped in the usual sterile manner. A Pfannenstiel incision was made and carried down through the subcutaneous tissue to the fascia. Fascial incision was made and extended transversely using Mayo scissors. The fascia was separated from the underlying rectus tissue superiorly and inferiorly. The peritoneum was identified and entered. Peritoneal incision was extended longitudinally. The utero-vesical peritoneal reflection was incised transversely and the bladder flap was bluntly freed from the lower uterine segment. A low transverse uterine incision(Kerr hysterotomy) was made. Delivered from OA with vacuum assistance presentation was a  female with Apgar scores of 8 at one minute and 9 at five minutes. Bulb suctioning gently performed. Neonatal  team in attendance.After the umbilical cord was clamped and cut cord blood was obtained for evaluation. The placenta was removed intact and appeared normal. The uterus was curetted with a dry lap pack. Good hemostasis was noted.The uterine outline, tubes and ovaries appeared normal. The uterine incision was closed with running locked sutures of 0 Monocryl x 2 layers. Hemostasis was observed. Lavage was carried out until clear.The parietal peritoneum was closed with a running 2-0 Monocryl suture. The fascia was then reapproximated with running sutures of 0 Monocryl. The skin was reapproximated with 4-0 vicryl after [place] closure with 2-0 plain.  Instrument, sponge, and needle counts were correct prior the abdominal closure and at the conclusion of the case.   Findings: As noted  Estimated Blood Loss:  600         Drains: foley                 Specimens: placenta                 Complications:  None; patient tolerated the procedure well.         Disposition: PACU - hemodynamically stable.         Condition: stable  Attending Attestation: I performed the procedure.

## 2018-04-14 NOTE — Anesthesia Preprocedure Evaluation (Addendum)
Anesthesia Evaluation  Patient identified by MRN, date of birth, ID band Patient awake    Reviewed: Allergy & Precautions, NPO status , Patient's Chart, lab work & pertinent test results  Airway Mallampati: I  TM Distance: >3 FB Neck ROM: Full    Dental no notable dental hx. (+) Teeth Intact, Dental Advisory Given   Pulmonary neg pulmonary ROS,    Pulmonary exam normal breath sounds clear to auscultation       Cardiovascular negative cardio ROS Normal cardiovascular exam Rhythm:Regular Rate:Normal     Neuro/Psych  Headaches, negative psych ROS   GI/Hepatic Neg liver ROS, hiatal hernia, GERD  ,  Endo/Other  negative endocrine ROS  Renal/GU negative Renal ROS  negative genitourinary   Musculoskeletal negative musculoskeletal ROS (+)   Abdominal   Peds negative pediatric ROS (+)  Hematology negative hematology ROS (+)   Anesthesia Other Findings C/S for 4th degree laceration during 1st pregnancy  Reproductive/Obstetrics (+) Pregnancy                            Anesthesia Physical Anesthesia Plan  ASA: III  Anesthesia Plan: Spinal   Post-op Pain Management:    Induction: Inhalational  PONV Risk Score and Plan: Treatment may vary due to age or medical condition  Airway Management Planned: Natural Airway  Additional Equipment:   Intra-op Plan:   Post-operative Plan:   Informed Consent: I have reviewed the patients History and Physical, chart, labs and discussed the procedure including the risks, benefits and alternatives for the proposed anesthesia with the patient or authorized representative who has indicated his/her understanding and acceptance.   Dental advisory given  Plan Discussed with: CRNA  Anesthesia Plan Comments:        Anesthesia Quick Evaluation

## 2018-04-14 NOTE — Anesthesia Procedure Notes (Signed)
Spinal  Patient location during procedure: OR Start time: 04/14/2018 2:46 PM End time: 04/14/2018 2:56 PM Staffing Anesthesiologist: Elmer PickerWoodrum, Shaunessy Dobratz L, MD Performed: anesthesiologist  Preanesthetic Checklist Completed: patient identified, surgical consent, pre-op evaluation, timeout performed, IV checked, risks and benefits discussed and monitors and equipment checked Spinal Block Patient position: sitting Prep: site prepped and draped and DuraPrep Patient monitoring: cardiac monitor, continuous pulse ox and blood pressure Approach: midline Location: L3-4 Injection technique: single-shot Needle Needle type: Pencan  Needle gauge: 24 G Needle length: 9 cm Assessment Sensory level: T6 Additional Notes Functioning IV was confirmed and monitors were applied. Sterile prep and drape, including hand hygiene and sterile gloves were used. The patient was positioned and the spine was prepped. The skin was anesthetized with lidocaine.  Free flow of clear CSF was obtained prior to injecting local anesthetic into the CSF.  The spinal needle aspirated freely following injection.  The needle was carefully withdrawn.  The patient tolerated the procedure well.

## 2018-04-14 NOTE — Transfer of Care (Signed)
Immediate Anesthesia Transfer of Care Note  Patient: Carol Rocha  Procedure(s) Performed: Primary CESAREAN SECTION (N/A )  Patient Location: PACU  Anesthesia Type:Spinal  Level of Consciousness: awake, alert  and oriented  Airway & Oxygen Therapy: Patient Spontanous Breathing  Post-op Assessment: Report given to RN and Post -op Vital signs reviewed and stable  Post vital signs: Reviewed and stable  Last Vitals:  Vitals Value Taken Time  BP 111/73 04/14/2018  4:03 PM  Temp    Pulse 114 04/14/2018  4:09 PM  Resp 17 04/14/2018  4:09 PM  SpO2 100 % 04/14/2018  4:09 PM  Vitals shown include unvalidated device data.  Last Pain:  Vitals:   04/14/18 1150  TempSrc: Oral  PainSc: 0-No pain      Patients Stated Pain Goal: 4 (04/14/18 1150)  Complications: No apparent anesthesia complications

## 2018-04-14 NOTE — Progress Notes (Signed)
   04/14/18 1900 04/14/18 1910  Vital Signs  BP (!) 80/40 (!) 77/45  BP Location Right Arm Left Arm  Pulse Rate 78 76  Pulse Rate Source Dinamap  --   Resp 20  --   Oxygen Therapy  SpO2 100 % 100 %  DR Malen GauzeFoster in room, recc called dr Billy Coastaavon. Already paged, waiting for response

## 2018-04-15 LAB — CBC
HEMATOCRIT: 27 % — AB (ref 36.0–46.0)
HEMOGLOBIN: 8.7 g/dL — AB (ref 12.0–15.0)
MCH: 29.5 pg (ref 26.0–34.0)
MCHC: 32.2 g/dL (ref 30.0–36.0)
MCV: 91.5 fL (ref 80.0–100.0)
Platelets: 246 10*3/uL (ref 150–400)
RBC: 2.95 MIL/uL — AB (ref 3.87–5.11)
RDW: 13.2 % (ref 11.5–15.5)
WBC: 11 10*3/uL — ABNORMAL HIGH (ref 4.0–10.5)

## 2018-04-15 MED ORDER — FERROUS SULFATE 325 (65 FE) MG PO TABS
325.0000 mg | ORAL_TABLET | Freq: Every day | ORAL | Status: DC
  Administered 2018-04-16: 325 mg via ORAL
  Filled 2018-04-15: qty 1

## 2018-04-15 MED ORDER — ONDANSETRON HCL 4 MG PO TABS
4.0000 mg | ORAL_TABLET | Freq: Three times a day (TID) | ORAL | Status: DC | PRN
  Administered 2018-04-15 – 2018-04-16 (×3): 4 mg via ORAL
  Filled 2018-04-15 (×3): qty 1

## 2018-04-15 NOTE — Lactation Note (Signed)
This note was copied from a baby's chart. Lactation Consultation Note  Patient Name: Carol Rocha's Date: 04/15/2018 Reason for consult: Initial assessment;Term Breastfeeding consultation services and support information given and reviewed.  Mom states breastfeeding did not go well with her first baby.  He had a tongue and lip tie and latching was difficult.  Baby was supplemented with formula.  Mom feels newborn is doing well at breast.  Baby did some cluster feeding during the night.  She would like a feeding assist.  Pecola LeisureBaby is in the nursery for a circumcision.  Instructed to feed with cues and [facility].  Maternal Data Does the patient have breastfeeding experience prior to this delivery?: Yes  Feeding Feeding Type: Breast Fed  LATCH Score Latch: Grasps breast easily, tongue down, lips flanged, rhythmical sucking.  Audible Swallowing: A few with stimulation  Type of Nipple: Everted at rest and after stimulation  Comfort (Breast/Nipple): Soft / non-tender  Hold (Positioning): No assistance needed to correctly position infant at breast.  LATCH Score: 9  Interventions    Lactation Tools Discussed/Used     Consult Status Consult Status: Follow-up Date: 04/16/18 Follow-up type: In-patient    Huston FoleyMOULDEN, Latavius Capizzi S 04/15/2018, 11:47 AM

## 2018-04-15 NOTE — Progress Notes (Signed)
Subjective: Postpartum Day 1: Cesarean Delivery Patient reports nausea, vomiting, incisional pain, tolerating PO, + flatus, + BM and no problems voiding.    Objective: Vital signs in last 24 hours: Temp:  [97.3 F (36.3 C)-98.8 F (37.1 C)] 98.6 F (37 C) (11/14 0850) Pulse Rate:  [76-114] 79 (11/14 0850) Resp:  [13-22] 13 (11/14 0850) BP: (77-132)/(40-80) 103/67 (11/14 0850) SpO2:  [97 %-100 %] 98 % (11/14 0850) Weight:  [74.7 kg] 74.7 kg (11/13 1150)  Physical Exam:  General: alert, cooperative and appears stated age Lochia: appropriate Uterine Fundus: firm Incision: healing well, no significant drainage, no significant erythema DVT Evaluation: No evidence of DVT seen on physical exam. Negative Homan's sign. No cords or calf tenderness.  Recent Labs    04/13/18 1045 04/15/18 0613  HGB 12.0 8.7*  HCT 36.3 27.0*    Assessment/Plan: Stable POD 1 Gestational Anemia Add PO Fe OOB and ambulate Circumcision consent done Status post Cesarean section. Doing well postoperatively.  Continue current care.  Eyden Dobie J 04/15/2018, 10:51 AM

## 2018-04-15 NOTE — Lactation Note (Signed)
This note was copied from a baby's chart. Lactation Consultation Note  Patient Name: Carol Rocha WUJWJ'XToday's Date: 04/15/2018 Reason for consult: Follow-up assessment Mom is uncomfortable with incisional pain.  Baby is sleepy after circumcision.  Positioned baby in football hold.  He did open wide and latched easily.  Baby fed well for 6 minutes then fell asleep.  Reassured mom.  Instructed to feed with cues and call out for assist prn.  Maternal Data Has patient been taught Hand Expression?: Yes Does the patient have breastfeeding experience prior to this delivery?: Yes  Feeding Feeding Type: Breast Fed  LATCH Score Latch: Grasps breast easily, tongue down, lips flanged, rhythmical sucking.  Audible Swallowing: A few with stimulation  Type of Nipple: Everted at rest and after stimulation  Comfort (Breast/Nipple): Soft / non-tender  Hold (Positioning): Assistance needed to correctly position infant at breast and maintain latch.  LATCH Score: 8  Interventions Interventions: Assisted with latch;Breast compression;Skin to skin;Adjust position;Breast massage;Support pillows;Hand express  Lactation Tools Discussed/Used     Consult Status Consult Status: Follow-up Date: 04/16/18 Follow-up type: In-patient    Huston FoleyMOULDEN, Bellamy Judson S 04/15/2018, 1:04 PM

## 2018-04-16 DIAGNOSIS — O9902 Anemia complicating childbirth: Secondary | ICD-10-CM | POA: Diagnosis not present

## 2018-04-16 MED ORDER — MAGNESIUM OXIDE 400 (241.3 MG) MG PO TABS
400.0000 mg | ORAL_TABLET | Freq: Every day | ORAL | Status: DC
  Administered 2018-04-16: 400 mg via ORAL
  Filled 2018-04-16 (×4): qty 1

## 2018-04-16 MED ORDER — POLYSACCHARIDE IRON COMPLEX 150 MG PO CAPS
150.0000 mg | ORAL_CAPSULE | Freq: Every day | ORAL | Status: DC
  Administered 2018-04-16 – 2018-04-17 (×2): 150 mg via ORAL
  Filled 2018-04-16 (×2): qty 1

## 2018-04-16 NOTE — Lactation Note (Addendum)
This note was copied from a baby's chart. Lactation Consultation Note Baby 7055 hrs old. Mom is sore. Baby latched onto breast in cradle position. Baby had a good latch. Mom denies painful latch, just at the beginning d/t sore. Discussed chin tug and lip flanging.  Mom c/o soreness top of everted nipples. Gave mom shells to assist in everting nipples more as well as soreness. Encouraged to listen for swallows, assess breast before and after BF for transfer. Mom stated baby was unsatisfied acting hungry that is why she wanted to supplement. Reviewed supplementation amount according to hours of age. Mom is using curve tip syring. Discussed mom not needing to supplement when milk comes.  Encouraged breast massage occasionally during feeding. Mom states she has pump some w/hardly anything, but what she has pumped, mom gave to baby. Encouraged to hand express after pumping. Mom states d/t soreness can't tolerate pump setting to high. Mom has comfort gels stuck to a paper. LC gave another pair of comfort gels. Instructed not to use w/coconut oil.  Answered questions. Discussed positioning, support, comfort. Mom didn't BF her 1 child very long d/t tongue tie- mom stated to painful. 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 Stated it didn't change to painful feedings so she stopped. Mom states feeding this moment is comfortable since latched. Stated unable to [facility] when wrong d/t to painful to [employer]unlatch. Discussed unlatching. encouraged mom to call for assistance if needed. LC feels mom needs to be seen again before d/c home.   Patient Name: Boy Avis Epleygustina Watton UJWJX'BToday's Date: 04/16/2018 Reason for consult: Follow-up assessment   Maternal Data    Feeding Feeding Type: Breast Fed  LATCH Score Latch: Grasps breast easily, tongue down, lips flanged, rhythmical sucking.  Audible Swallowing: Spontaneous and intermittent  Type of Nipple: Everted at rest and after stimulation  Comfort  (Breast/Nipple): Filling, red/small blisters or bruises, mild/mod discomfort  Hold (Positioning): No assistance needed to correctly position infant at breast.  LATCH Score: 9  Interventions Interventions: Breast feeding basics reviewed;Support pillows;Position options;Skin to skin;Breast massage;Shells;Comfort gels;DEBP;Breast compression  Lactation Tools Discussed/Used     Consult Status Consult Status: Follow-up Date: 04/17/18 Follow-up type: In-patient    Charyl DancerCARVER, Lillian Ballester G 04/16/2018, 11:08 PM

## 2018-04-16 NOTE — Progress Notes (Signed)
Subjective: POD# 2 Live born female  Birth Weight: 7 lb 12.9 oz (3540 g) APGAR: 9, 9  Newborn Delivery   Birth date/time:  04/14/2018 15:15:00 Delivery type:  C-Section, Vacuum Assisted Trial of labor:  No C-section categorization:  Primary    Baby name: Ignacia BayleyJulio Delivering provider: Olivia MackieAAVON, RICHARD   circumcision completed Feeding: breast  Pain control at delivery: Spinal   Reports feeling well.  Patient reports tolerating PO.   Breast symptoms: working on latch, needs assist Pain controlled with PO meds Denies HA/SOB/C/P/N/V/dizziness. Flatus present. She reports vaginal bleeding as normal, without clots.  She is ambulating, urinating without difficulty.     Objective:   VS:    Vitals:   04/15/18 1447 04/15/18 1555 04/16/18 0035 04/16/18 0539  BP: 112/79 127/83 125/79 120/72  Pulse: 79 82 78 85  Resp: 14 18 18 16   Temp: 97.7 F (36.5 C) 98.4 F (36.9 C) 97.9 F (36.6 C) 97.8 F (36.6 C)  TempSrc: Oral Oral Oral Oral  SpO2: 100%  98% 100%  Weight:      Height:        No intake or output data in the 24 hours ending 04/16/18 1202      Recent Labs    04/15/18 0613  WBC 11.0*  HGB 8.7*  HCT 27.0*  PLT 246     Blood type: --/--/O POS (11/12 1045)  Rubella: Immune (04/11 0000)  Vaccines: TDaP UTD         Flu    UTD   Physical Exam:  General: alert, cooperative and no distress Abdomen: soft, nontender, normal bowel sounds Incision: clean, dry and intact Uterine Fundus: firm, below umbilicus, nontender Lochia: minimal Ext: no edema, redness or tenderness in the calves or thighs      Assessment/Plan: 32 y.o.   POD# 2. [id number]                  Principal Problem:   1C/S 11/13 - Indication: Hx 4th deg lac Active Problems:   History of 4th deg perineal laceration with delivery   Postpartum care following cesarean delivery   Maternal anemia, with delivery - started oral Fe and Mag Ox  Doing well, stable.               Advance diet as  tolerated Encourage rest when baby rests Breastfeeding support / LS consult Encourage to ambulate Routine post-op care Anticipate DC in AM  Neta Mendsaniela C , CNM, MSN 04/16/2018, 12:02 PM

## 2018-04-16 NOTE — Lactation Note (Signed)
This note was copied from a baby's chart. Lactation Consultation Note  Patient Name: Carol Rocha Reason for consult: Follow-up assessment Baby is 9141 hours old and at a 8% weight loss.  Parents concerned because baby cluster fed all night and seemed frantic after feeds.  Their first baby had excessive weight loss initially.  Discussed supplementing with formula after feeds if baby still acting hungry.  Parents agreeable.  Baby syringe fed 11 mls of formula and tolerated well.  Baby relaxed after feeding.  Symphony pump set up and initiated.  Instructed to post pump every 3 hours and give any expressed milk back to baby.  Mom will continue to feed with cues and call for assist prn.  Maternal Data    Feeding Feeding Type: Formula  LATCH Score                   Interventions    Lactation Tools Discussed/Used Pump Review: Setup, frequency, and cleaning;Milk Storage Initiated by:: LM Date initiated:: 04/16/18   Consult Status Consult Status: Follow-up Date: 04/17/18 Follow-up type: In-patient    Huston FoleyMOULDEN, [name], 8:35 AM

## 2018-04-17 ENCOUNTER — Other Ambulatory Visit: Payer: Self-pay

## 2018-04-17 ENCOUNTER — Encounter (HOSPITAL_COMMUNITY): Payer: Self-pay | Admitting: *Deleted

## 2018-04-17 MED ORDER — SENNOSIDES-DOCUSATE SODIUM 8.6-50 MG PO TABS: 2.0000 | ORAL_TABLET | ORAL | Status: DC

## 2018-04-17 MED ORDER — OXYCODONE-ACETAMINOPHEN 5-325 MG PO TABS: 1.0000 | ORAL_TABLET | ORAL | 0 refills | Status: DC | PRN

## 2018-04-17 MED ORDER — POLYSACCHARIDE IRON COMPLEX 150 MG PO CAPS: 150.0000 mg | ORAL_CAPSULE | Freq: Every day | ORAL | Status: DC

## 2018-04-17 MED ORDER — IBUPROFEN 600 MG PO TABS: 600.0000 mg | ORAL_TABLET | Freq: Four times a day (QID) | ORAL | 0 refills | Status: DC

## 2018-04-17 MED ORDER — SIMETHICONE 80 MG PO CHEW: 80.0000 mg | CHEWABLE_TABLET | ORAL | 0 refills | Status: DC | PRN

## 2018-04-17 MED ORDER — COCONUT OIL OIL: 1.0000 "application " | TOPICAL_OIL | 0 refills | Status: DC | PRN

## 2018-04-17 MED ORDER — MAGNESIUM OXIDE 400 (241.3 MG) MG PO TABS: 400.0000 mg | ORAL_TABLET | Freq: Every day | ORAL | Status: DC

## 2018-04-17 NOTE — Discharge Summary (Signed)
OB Discharge Summary  Patient Name: Carol Rocha DOB: 1986-01-07 MRN: [id number]  Date of admission: 04/14/2018 Delivering provider: Olivia MackieAAVON, RICHARD   Date of discharge: 04/17/2018  Admitting diagnosis: History of 4th degree perineal laceration Intrauterine pregnancy: 3752w2d     Secondary diagnosis:Principal Problem:   1C/S 11/13 - Indication: Hx 4th deg lac Active Problems:   History of 4th deg perineal laceration with delivery   Postpartum care following cesarean delivery   Maternal anemia, with delivery  Additional problems:none     Discharge diagnosis:  Patient Active Problem List   Diagnosis Date Noted  . 1C/S 11/13 - Indication: Hx 4th deg lac 04/16/2018  . Postpartum care following cesarean delivery 04/16/2018  . Maternal anemia, with delivery 04/16/2018  . History of 4th deg perineal laceration with delivery 04/14/2018  . SVD (spontaneous vaginal delivery) 08/06/2015  . History of chicken pox   . Migraine                                                                 Post partum procedures: none Pain control: Spinal  Complications: None  Hospital course:  Sceduled C/S   32 y.o. yo G2P2002 at 8152w2d was admitted to the hospital 04/14/2018 for scheduled cesarean section with the following indication:history of 4th degree perineal laceration.  Membrane Rupture Time/Date: 3:14 PM ,04/14/2018   Patient delivered a Viable infant.04/14/2018  Details of operation can be found in separate operative note.  Pateint had an uncomplicated postpartum course.  She is ambulating, tolerating a regular diet, passing flatus, and urinating well. Patient is discharged home in stable condition on  04/17/18         Physical exam  Vitals:   04/16/18 0539 04/16/18 1409 04/16/18 2222 04/17/18 0528  BP: 120/72 103/67 132/81 128/82  Pulse: 85 89 96 92  Resp: 16 18 18 18   Temp: 97.8 F (36.6 C) 98.3 F (36.8 C) 98 F (36.7 C) 97.9 F (36.6 C)  TempSrc: Oral Oral Oral Oral  SpO2: 100%  99%    Weight:      Height:       General: alert, cooperative and no distress Lochia: appropriate Uterine Fundus: firm Incision: Dressing is clean, dry, and intact DVT Evaluation: No cords or calf tenderness. No significant calf/ankle edema. Labs: Lab Results  Component Value Date   WBC 11.0 (H) 04/15/2018   HGB 8.7 (L) 04/15/2018   HCT 27.0 (L) 04/15/2018   MCV 91.5 04/15/2018   PLT 246 04/15/2018   CMP Latest Ref Rng & Units 10/07/2017  Glucose 65 - 99 mg/dL 409(W103(H)  BUN 6 - 20 mg/dL 13  Creatinine 1.190.44 - 1.471.00 mg/dL 8.290.49  Sodium 562135 - 130145 mmol/L 137  Potassium 3.5 - 5.1 mmol/L 3.8  Chloride 101 - 111 mmol/L 105  CO2 22 - 32 mmol/L 23  Calcium 8.9 - 10.3 mg/dL 8.6(V8.7(L)  Total Protein 6.5 - 8.1 g/dL 6.9  Total Bilirubin 0.3 - 1.2 mg/dL 0.5  Alkaline Phos 38 - 126 U/L 63  AST 15 - 41 U/L 21  ALT 14 - 54 U/L 31    Vaccines: TDaP UTD         Flu    UTD  Discharge instruction: per After Visit Summary and "Baby and Me Booklet".  After Visit Meds:  Allergies as of 04/17/2018   No Known Allergies     Medication List    TAKE these medications   acetaminophen 500 MG tablet Commonly known as:  TYLENOL Take 500 mg by mouth every 6 (six) hours as needed for moderate pain.   coconut oil Oil Apply 1 application topically as needed.   diphenhydrAMINE 25 MG tablet Commonly known as:  BENADRYL Take 25 mg by mouth at bedtime as needed for sleep.   ibuprofen 600 MG tablet Commonly known as:  ADVIL,MOTRIN Take 1 tablet (600 mg total) by mouth every 6 (six) hours.   iron polysaccharides 150 MG capsule Commonly known as:  NIFEREX Take 1 capsule (150 mg total) by mouth daily. Start taking on:  04/18/2018   magnesium oxide 400 (241.3 Mg) MG tablet Commonly known as:  MAG-OX Take 1 tablet (400 mg total) by mouth daily. Start taking on:  04/18/2018   oxyCODONE-acetaminophen 5-325 MG tablet Commonly known as:  PERCOCET/ROXICET Take 1 tablet by mouth every 4 (four) hours as  needed (pain scale 4-7).   prenatal multivitamin Tabs tablet Take 1 tablet by mouth 3 (three) times daily.   ranitidine 150 MG capsule Commonly known as:  ZANTAC Take 150 mg by mouth 2 (two) times daily.   senna-docusate 8.6-50 MG tablet Commonly known as:  Senokot-S Take 2 tablets by mouth daily. Start taking on:  04/18/2018   simethicone 80 MG chewable tablet Commonly known as:  MYLICON Chew 1 tablet (80 mg total) by mouth as needed for flatulence.       Diet: routine diet  Activity: Advance as tolerated. Pelvic rest for 6 weeks.   Postpartum contraception: Not Discussed  Newborn Data: Live born female  Birth Weight: 7 lb 12.9 oz (3540 g) APGAR: 9, 9  Newborn Delivery   Birth date/time:  04/14/2018 15:15:00 Delivery type:  C-Section, Vacuum Assisted Trial of labor:  No C-section categorization:  Primary     named Ignacia Bayley Baby Feeding: Breast Disposition:home with mother   Delivery Report:  Review the Delivery Report for details.    Follow up: Follow-up Information    Olivia Mackie, MD. Schedule an appointment as soon as possible for a visit in 6 week(s).   Specialty:  Obstetrics and Gynecology Contact information: [address] [place] [address] [phone]             Signed: Neta Mends, CNM, MSN 04/17/2018, 11:13 AM

## 2018-04-17 NOTE — Lactation Note (Signed)
This note was copied from a baby's chart. Lactation Consultation Note:  Infant is 3467 hours old and has had a weight loss of 9.2 %. Mother reports that infant just breastfed.  Mother reluctant to have LC come into the room.  LC offer some educational tips and mother more receptive.  Mother reports that she has a Spectra pump at home.  Advised mother to post pump after each breastfeeding and supplement infant with ebm and formula.  Advised mother to pump for at least 15-20 mins. 6-8 times daily.  Mother reports [facility].  Discussed cue base feeding and feeding infant at least 8-12 times in 24 hours.   Mother denies having any breastfeeding questions or concerns.  Mother did report that she failed at breastfeeding with her first.  Then she reports that she supplemented and pumped her breast for 6 months.  Lots of support and encouragement given to mother .  Discussed treatment and prevention of engorgement.  [facility].    Patient Name: Boy Avis Epleygustina Winnett WGNFA'OToday's Date: 04/17/2018 Reason for consult: Follow-up assessment   Maternal Data    Feeding Feeding Type: Breast Fed  LATCH Score Latch: Grasps breast easily, tongue down, lips flanged, rhythmical sucking.  Audible Swallowing: Spontaneous and intermittent  Type of Nipple: Everted at rest and after stimulation  Comfort (Breast/Nipple): Soft / non-tender  Hold (Positioning): No assistance needed to correctly position infant at breast.  LATCH Score: 10  Interventions Interventions: Hand pump;DEBP  Lactation Tools Discussed/Used     Consult Status Consult Status: Complete Date: 04/17/18    Stevan BornKendrick, Mada Sadik [facility] 04/17/2018, 1:02 PM

## 2018-05-28 DIAGNOSIS — Z130 Encounter for screening for diseases of the blood and blood-forming organs and certain disorders involving the immune mechanism: Secondary | ICD-10-CM | POA: Diagnosis not present

## 2018-06-09 DIAGNOSIS — N926 Irregular menstruation, unspecified: Secondary | ICD-10-CM | POA: Diagnosis not present

## 2018-10-13 DIAGNOSIS — M6208 Separation of muscle (nontraumatic), other site: Secondary | ICD-10-CM | POA: Diagnosis not present

## 2019-10-27 LAB — OB RESULTS CONSOLE HEPATITIS B SURFACE ANTIGEN: Hepatitis B Surface Ag: NEGATIVE

## 2019-10-27 LAB — OB RESULTS CONSOLE HIV ANTIBODY (ROUTINE TESTING): HIV: NONREACTIVE

## 2019-10-27 LAB — OB RESULTS CONSOLE RUBELLA ANTIBODY, IGM: Rubella: IMMUNE

## 2020-05-02 ENCOUNTER — Other Ambulatory Visit: Payer: Self-pay | Admitting: Obstetrics and Gynecology

## 2020-05-09 NOTE — Patient Instructions (Addendum)
Carol Rocha  05/09/2020   Your procedure is scheduled on:  05/23/2020  Arrive at 0630 at [employer] [facility] on [address] at [facility]  and [employer]. You are invited to use the FREE valet parking or use the Visitor's parking deck.  Pick up the phone at the desk and dial [phone].  Call this number if you have problems the morning of surgery: [phone]  Remember:   Do not eat food:(After Midnight) Desps de medianoche.  Do not drink clear liquids: (After Midnight) Desps de medianoche.  Take these medicines the morning of surgery with A SIP OF WATER:  none   Do not wear jewelry, make-up or nail polish.  Do not wear lotions, powders, or perfumes. Do not wear deodorant.  Do not shave 48 hours prior to surgery.  Do not bring valuables to the hospital.  [facility] is not   responsible for any belongings or valuables brought to the hospital.  Contacts, dentures or bridgework may not be worn into surgery.  Leave suitcase in the car. After surgery it may be brought to your room.  For patients admitted to the hospital, checkout time is 11:00 AM the day of              discharge.      Please read over the following fact sheets that you were given:     Preparing for Surgery

## 2020-05-10 ENCOUNTER — Encounter (HOSPITAL_COMMUNITY): Payer: Self-pay

## 2020-05-21 ENCOUNTER — Other Ambulatory Visit (HOSPITAL_COMMUNITY)
Admission: RE | Admit: 2020-05-21 | Discharge: 2020-05-21 | Disposition: A | Discharge status: Home / Self Care | Payer: 59 | Source: Ambulatory Visit | Attending: Obstetrics and Gynecology | Admitting: Obstetrics and Gynecology

## 2020-05-21 ENCOUNTER — Other Ambulatory Visit: Payer: Self-pay

## 2020-05-21 ENCOUNTER — Encounter (HOSPITAL_COMMUNITY)
Admission: RE | Admit: 2020-05-21 | Discharge: 2020-05-21 | Disposition: A | Discharge status: Home / Self Care | Payer: 59 | Source: Ambulatory Visit | Attending: Obstetrics and Gynecology | Admitting: Obstetrics and Gynecology

## 2020-05-21 DIAGNOSIS — Z20822 Contact with and (suspected) exposure to covid-19: Secondary | ICD-10-CM | POA: Insufficient documentation

## 2020-05-21 DIAGNOSIS — Z01812 Encounter for preprocedural laboratory examination: Secondary | ICD-10-CM | POA: Insufficient documentation

## 2020-05-21 LAB — SARS CORONAVIRUS 2 (TAT 6-24 HRS): SARS Coronavirus 2: NEGATIVE

## 2020-05-21 LAB — CBC
HCT: 34.1 % — ABNORMAL LOW (ref 36.0–46.0)
Hemoglobin: 10.8 g/dL — ABNORMAL LOW (ref 12.0–15.0)
MCH: 29.2 pg (ref 26.0–34.0)
MCHC: 31.7 g/dL (ref 30.0–36.0)
MCV: 92.2 fL (ref 80.0–100.0)
Platelets: 298 10*3/uL (ref 150–400)
RBC: 3.7 MIL/uL — ABNORMAL LOW (ref 3.87–5.11)
RDW: 13.3 % (ref 11.5–15.5)
WBC: 10.6 10*3/uL — ABNORMAL HIGH (ref 4.0–10.5)
nRBC: 0 % (ref 0.0–0.2)

## 2020-05-21 LAB — TYPE AND SCREEN
ABO/RH(D): O POS
Antibody Screen: NEGATIVE

## 2020-05-22 LAB — RPR: RPR Ser Ql: NONREACTIVE

## 2020-05-23 ENCOUNTER — Encounter (HOSPITAL_COMMUNITY)
Admission: RE | Disposition: A | Discharge status: Home / Self Care | Payer: Self-pay | Source: Home / Self Care | Attending: Obstetrics and Gynecology

## 2020-05-23 ENCOUNTER — Encounter (HOSPITAL_COMMUNITY): Payer: Self-pay | Admitting: Obstetrics and Gynecology

## 2020-05-23 ENCOUNTER — Inpatient Hospital Stay (HOSPITAL_COMMUNITY)
Admission: RE | Admit: 2020-05-23 | Discharge: 2020-05-26 | DRG: 787 | Disposition: A | Discharge status: Home / Self Care | Payer: 59 | Attending: Obstetrics and Gynecology | Admitting: Obstetrics and Gynecology

## 2020-05-23 ENCOUNTER — Other Ambulatory Visit: Payer: Self-pay

## 2020-05-23 ENCOUNTER — Inpatient Hospital Stay (HOSPITAL_COMMUNITY): Payer: 59 | Admitting: Anesthesiology

## 2020-05-23 DIAGNOSIS — O34211 Maternal care for low transverse scar from previous cesarean delivery: Secondary | ICD-10-CM | POA: Diagnosis present

## 2020-05-23 DIAGNOSIS — R4182 Altered mental status, unspecified: Secondary | ICD-10-CM | POA: Diagnosis not present

## 2020-05-23 DIAGNOSIS — O9081 Anemia of the puerperium: Secondary | ICD-10-CM | POA: Diagnosis not present

## 2020-05-23 DIAGNOSIS — R531 Weakness: Secondary | ICD-10-CM | POA: Diagnosis not present

## 2020-05-23 DIAGNOSIS — Z3A39 39 weeks gestation of pregnancy: Secondary | ICD-10-CM | POA: Diagnosis not present

## 2020-05-23 DIAGNOSIS — O99345 Other mental disorders complicating the puerperium: Secondary | ICD-10-CM | POA: Diagnosis not present

## 2020-05-23 DIAGNOSIS — Z20822 Contact with and (suspected) exposure to covid-19: Secondary | ICD-10-CM | POA: Diagnosis present

## 2020-05-23 DIAGNOSIS — O99893 Other specified diseases and conditions complicating puerperium: Secondary | ICD-10-CM | POA: Diagnosis not present

## 2020-05-23 DIAGNOSIS — O34219 Maternal care for unspecified type scar from previous cesarean delivery: Secondary | ICD-10-CM | POA: Diagnosis present

## 2020-05-23 DIAGNOSIS — Z98891 History of uterine scar from previous surgery: Secondary | ICD-10-CM

## 2020-05-23 DIAGNOSIS — D62 Acute posthemorrhagic anemia: Secondary | ICD-10-CM | POA: Diagnosis not present

## 2020-05-23 SURGERY — Surgical Case
Anesthesia: Spinal

## 2020-05-23 MED ORDER — SENNOSIDES-DOCUSATE SODIUM 8.6-50 MG PO TABS
2.0000 | ORAL_TABLET | Freq: Every day | ORAL | Status: DC
  Administered 2020-05-24 – 2020-05-25 (×2): 2 via ORAL
  Filled 2020-05-23 (×2): qty 2

## 2020-05-23 MED ORDER — ONDANSETRON HCL 4 MG/2ML IJ SOLN
INTRAMUSCULAR | Status: AC
  Filled 2020-05-23: qty 2

## 2020-05-23 MED ORDER — BUPIVACAINE IN DEXTROSE 0.75-8.25 % IT SOLN
INTRATHECAL | Status: DC | PRN
  Administered 2020-05-23: 1.7 mL via INTRATHECAL

## 2020-05-23 MED ORDER — ONDANSETRON HCL 4 MG/2ML IJ SOLN
INTRAMUSCULAR | Status: DC | PRN
  Administered 2020-05-23: 4 mg via INTRAVENOUS

## 2020-05-23 MED ORDER — METHYLERGONOVINE MALEATE 0.2 MG PO TABS: 0.2000 mg | ORAL_TABLET | ORAL | Status: DC | PRN

## 2020-05-23 MED ORDER — CHLORHEXIDINE GLUCONATE 0.12 % MT SOLN
15.0000 mL | Freq: Once | OROMUCOSAL | Status: AC
  Administered 2020-05-23: 07:00:00 15 mL via OROMUCOSAL

## 2020-05-23 MED ORDER — DEXAMETHASONE SODIUM PHOSPHATE 4 MG/ML IJ SOLN
INTRAMUSCULAR | Status: AC
  Filled 2020-05-23: qty 1

## 2020-05-23 MED ORDER — NALBUPHINE HCL 10 MG/ML IJ SOLN: 5.0000 mg | INTRAMUSCULAR | Status: DC | PRN

## 2020-05-23 MED ORDER — METOCLOPRAMIDE HCL 5 MG/ML IJ SOLN
INTRAMUSCULAR | Status: AC
Start: 2020-05-23 — End: ?
  Filled 2020-05-23: qty 2

## 2020-05-23 MED ORDER — MEPERIDINE HCL 25 MG/ML IJ SOLN: 6.2500 mg | INTRAMUSCULAR | Status: DC | PRN

## 2020-05-23 MED ORDER — DIPHENHYDRAMINE HCL 25 MG PO CAPS: 25.0000 mg | ORAL_CAPSULE | Freq: Four times a day (QID) | ORAL | Status: DC | PRN

## 2020-05-23 MED ORDER — LACTATED RINGERS IV SOLN: INTRAVENOUS | Status: DC

## 2020-05-23 MED ORDER — SODIUM CHLORIDE 0.9% FLUSH: 3.0000 mL | INTRAVENOUS | Status: DC | PRN

## 2020-05-23 MED ORDER — KETOROLAC TROMETHAMINE 30 MG/ML IJ SOLN
30.0000 mg | Freq: Four times a day (QID) | INTRAMUSCULAR | Status: AC
  Administered 2020-05-23 – 2020-05-24 (×4): 30 mg via INTRAVENOUS
  Filled 2020-05-23 (×4): qty 1

## 2020-05-23 MED ORDER — BUPIVACAINE HCL (PF) 0.25 % IJ SOLN
INTRAMUSCULAR | Status: AC
  Filled 2020-05-23: qty 20

## 2020-05-23 MED ORDER — IBUPROFEN 800 MG PO TABS
800.0000 mg | ORAL_TABLET | Freq: Four times a day (QID) | ORAL | Status: DC
  Administered 2020-05-24 – 2020-05-26 (×8): 800 mg via ORAL
  Filled 2020-05-23 (×8): qty 1

## 2020-05-23 MED ORDER — POVIDONE-IODINE 10 % EX SWAB
2.0000 "application " | Freq: Once | CUTANEOUS | Status: AC
  Administered 2020-05-23: 2 via TOPICAL

## 2020-05-23 MED ORDER — WITCH HAZEL-GLYCERIN EX PADS: 1.0000 "application " | MEDICATED_PAD | CUTANEOUS | Status: DC | PRN

## 2020-05-23 MED ORDER — DEXAMETHASONE SODIUM PHOSPHATE 4 MG/ML IJ SOLN
INTRAMUSCULAR | Status: DC | PRN
  Administered 2020-05-23: 4 mg via INTRAVENOUS

## 2020-05-23 MED ORDER — KETOROLAC TROMETHAMINE 30 MG/ML IJ SOLN: 30.0000 mg | Freq: Four times a day (QID) | INTRAMUSCULAR | Status: DC | PRN

## 2020-05-23 MED ORDER — PHENYLEPHRINE HCL-NACL 20-0.9 MG/250ML-% IV SOLN: INTRAVENOUS | Status: DC | PRN

## 2020-05-23 MED ORDER — COCONUT OIL OIL: 1.0000 "application " | TOPICAL_OIL | Status: DC | PRN

## 2020-05-23 MED ORDER — FENTANYL CITRATE (PF) 100 MCG/2ML IJ SOLN: 25.0000 ug | INTRAMUSCULAR | Status: DC | PRN

## 2020-05-23 MED ORDER — SCOPOLAMINE 1 MG/3DAYS TD PT72
1.0000 | MEDICATED_PATCH | Freq: Once | TRANSDERMAL | Status: DC
  Administered 2020-05-23: 07:00:00 1.5 mg via TRANSDERMAL

## 2020-05-23 MED ORDER — PRENATAL MULTIVITAMIN CH
1.0000 | ORAL_TABLET | Freq: Every day | ORAL | Status: DC
  Administered 2020-05-24 – 2020-05-26 (×3): 1 via ORAL
  Filled 2020-05-23 (×3): qty 1

## 2020-05-23 MED ORDER — CHLORHEXIDINE GLUCONATE 0.12 % MT SOLN
OROMUCOSAL | Status: AC
  Filled 2020-05-23: qty 15

## 2020-05-23 MED ORDER — DIPHENHYDRAMINE HCL 50 MG/ML IJ SOLN: 12.5000 mg | INTRAMUSCULAR | Status: DC | PRN

## 2020-05-23 MED ORDER — MENTHOL 3 MG MT LOZG: 1.0000 | LOZENGE | OROMUCOSAL | Status: DC | PRN

## 2020-05-23 MED ORDER — SOD CITRATE-CITRIC ACID 500-334 MG/5ML PO SOLN
30.0000 mL | Freq: Once | ORAL | Status: AC
  Administered 2020-05-23: 07:00:00 30 mL via ORAL

## 2020-05-23 MED ORDER — METHYLERGONOVINE MALEATE 0.2 MG/ML IJ SOLN: 0.2000 mg | INTRAMUSCULAR | Status: DC | PRN

## 2020-05-23 MED ORDER — ONDANSETRON HCL 4 MG/2ML IJ SOLN
4.0000 mg | Freq: Three times a day (TID) | INTRAMUSCULAR | Status: DC | PRN
Start: 2020-05-23 — End: 2020-05-26

## 2020-05-23 MED ORDER — ZOLPIDEM TARTRATE 5 MG PO TABS: 5.0000 mg | ORAL_TABLET | Freq: Every evening | ORAL | Status: DC | PRN

## 2020-05-23 MED ORDER — PROMETHAZINE HCL 25 MG/ML IJ SOLN: 6.2500 mg | INTRAMUSCULAR | Status: DC | PRN

## 2020-05-23 MED ORDER — CEFAZOLIN SODIUM-DEXTROSE 2-4 GM/100ML-% IV SOLN
2.0000 g | INTRAVENOUS | Status: AC
  Administered 2020-05-23: 09:00:00 2 g via INTRAVENOUS

## 2020-05-23 MED ORDER — OXYTOCIN-SODIUM CHLORIDE 30-0.9 UT/500ML-% IV SOLN
INTRAVENOUS | Status: DC | PRN
  Administered 2020-05-23: 30 [IU] via INTRAVENOUS

## 2020-05-23 MED ORDER — SODIUM CHLORIDE 0.9 % IV SOLN: INTRAVENOUS | Status: DC | PRN

## 2020-05-23 MED ORDER — MORPHINE SULFATE (PF) 0.5 MG/ML IJ SOLN
INTRAMUSCULAR | Status: DC | PRN
  Administered 2020-05-23: 150 ug via EPIDURAL

## 2020-05-23 MED ORDER — SCOPOLAMINE 1 MG/3DAYS TD PT72: 1.0000 | MEDICATED_PATCH | Freq: Once | TRANSDERMAL | Status: DC

## 2020-05-23 MED ORDER — DIPHENHYDRAMINE HCL 25 MG PO CAPS: 25.0000 mg | ORAL_CAPSULE | ORAL | Status: DC | PRN

## 2020-05-23 MED ORDER — SIMETHICONE 80 MG PO CHEW: 80.0000 mg | CHEWABLE_TABLET | ORAL | Status: DC | PRN

## 2020-05-23 MED ORDER — SOD CITRATE-CITRIC ACID 500-334 MG/5ML PO SOLN
ORAL | Status: AC
  Filled 2020-05-23: qty 30

## 2020-05-23 MED ORDER — OXYCODONE HCL 5 MG PO TABS: 5.0000 mg | ORAL_TABLET | Freq: Once | ORAL | Status: DC | PRN

## 2020-05-23 MED ORDER — SCOPOLAMINE 1 MG/3DAYS TD PT72
MEDICATED_PATCH | TRANSDERMAL | Status: AC
  Filled 2020-05-23: qty 1

## 2020-05-23 MED ORDER — BUPIVACAINE HCL (PF) 0.25 % IJ SOLN
INTRAMUSCULAR | Status: DC | PRN
  Administered 2020-05-23: 20 mL

## 2020-05-23 MED ORDER — DIBUCAINE (PERIANAL) 1 % EX OINT: 1.0000 "application " | TOPICAL_OINTMENT | CUTANEOUS | Status: DC | PRN

## 2020-05-23 MED ORDER — ORAL CARE MOUTH RINSE: 15.0000 mL | Freq: Once | OROMUCOSAL | Status: AC

## 2020-05-23 MED ORDER — NALOXONE HCL 4 MG/10ML IJ SOLN
1.0000 ug/kg/h | INTRAMUSCULAR | Status: DC | PRN
  Filled 2020-05-23: qty 5

## 2020-05-23 MED ORDER — ACETAMINOPHEN 500 MG PO TABS
1000.0000 mg | ORAL_TABLET | Freq: Four times a day (QID) | ORAL | Status: AC
  Administered 2020-05-23 (×3): 1000 mg via ORAL
  Filled 2020-05-23 (×4): qty 2

## 2020-05-23 MED ORDER — PHENYLEPHRINE HCL (PRESSORS) 10 MG/ML IV SOLN
INTRAVENOUS | Status: DC | PRN
  Administered 2020-05-23 (×3): 80 ug via INTRAVENOUS

## 2020-05-23 MED ORDER — PHENYLEPHRINE HCL-NACL 20-0.9 MG/250ML-% IV SOLN
INTRAVENOUS | Status: DC | PRN
  Administered 2020-05-23: 60 ug/min via INTRAVENOUS

## 2020-05-23 MED ORDER — MORPHINE SULFATE (PF) 0.5 MG/ML IJ SOLN
INTRAMUSCULAR | Status: AC
  Filled 2020-05-23: qty 10

## 2020-05-23 MED ORDER — NALBUPHINE HCL 10 MG/ML IJ SOLN
5.0000 mg | Freq: Once | INTRAMUSCULAR | Status: DC | PRN
Start: 2020-05-23 — End: 2020-05-26

## 2020-05-23 MED ORDER — NALOXONE HCL 0.4 MG/ML IJ SOLN: 0.4000 mg | INTRAMUSCULAR | Status: DC | PRN

## 2020-05-23 MED ORDER — AMISULPRIDE (ANTIEMETIC) 5 MG/2ML IV SOLN: 10.0000 mg | Freq: Once | INTRAVENOUS | Status: DC | PRN

## 2020-05-23 MED ORDER — FENTANYL CITRATE (PF) 100 MCG/2ML IJ SOLN
INTRAMUSCULAR | Status: DC | PRN
  Administered 2020-05-23: 15 ug via INTRAVENOUS

## 2020-05-23 MED ORDER — OXYCODONE HCL 5 MG/5ML PO SOLN: 5.0000 mg | Freq: Once | ORAL | Status: DC | PRN

## 2020-05-23 MED ORDER — KETOROLAC TROMETHAMINE 30 MG/ML IJ SOLN
30.0000 mg | Freq: Four times a day (QID) | INTRAMUSCULAR | Status: DC | PRN
  Administered 2020-05-23: 30 mg via INTRAVENOUS

## 2020-05-23 MED ORDER — SIMETHICONE 80 MG PO CHEW
80.0000 mg | CHEWABLE_TABLET | Freq: Three times a day (TID) | ORAL | Status: DC
  Administered 2020-05-23 – 2020-05-25 (×8): 80 mg via ORAL
  Filled 2020-05-23 (×8): qty 1

## 2020-05-23 MED ORDER — OXYTOCIN-SODIUM CHLORIDE 30-0.9 UT/500ML-% IV SOLN
2.5000 [IU]/h | INTRAVENOUS | Status: AC
  Administered 2020-05-23: 12:00:00 2.5 [IU]/h via INTRAVENOUS

## 2020-05-23 MED ORDER — OXYCODONE-ACETAMINOPHEN 5-325 MG PO TABS
1.0000 | ORAL_TABLET | ORAL | Status: DC | PRN
  Administered 2020-05-23 – 2020-05-24 (×2): 2 via ORAL
  Administered 2020-05-24 – 2020-05-26 (×6): 1 via ORAL
  Filled 2020-05-23 (×2): qty 1
  Filled 2020-05-23: qty 2
  Filled 2020-05-23: qty 1
  Filled 2020-05-23: qty 2
  Filled 2020-05-23 (×3): qty 1

## 2020-05-23 MED ORDER — ACETAMINOPHEN 10 MG/ML IV SOLN: 1000.0000 mg | Freq: Once | INTRAVENOUS | Status: DC | PRN

## 2020-05-23 MED ORDER — NALBUPHINE HCL 10 MG/ML IJ SOLN: 5.0000 mg | Freq: Once | INTRAMUSCULAR | Status: DC | PRN

## 2020-05-23 MED ORDER — KETOROLAC TROMETHAMINE 30 MG/ML IJ SOLN
INTRAMUSCULAR | Status: AC
  Filled 2020-05-23: qty 1

## 2020-05-23 MED ORDER — METOCLOPRAMIDE HCL 5 MG/ML IJ SOLN
INTRAMUSCULAR | Status: DC | PRN
  Administered 2020-05-23: 10 mg via INTRAVENOUS

## 2020-05-23 MED ORDER — FENTANYL CITRATE (PF) 100 MCG/2ML IJ SOLN
INTRAMUSCULAR | Status: DC | PRN
  Administered 2020-05-23: 10 ug via INTRAVENOUS

## 2020-05-23 MED ORDER — FENTANYL CITRATE (PF) 100 MCG/2ML IJ SOLN
INTRAMUSCULAR | Status: AC
  Filled 2020-05-23: qty 2

## 2020-05-23 MED ORDER — CEFAZOLIN SODIUM-DEXTROSE 2-4 GM/100ML-% IV SOLN
INTRAVENOUS | Status: AC
  Filled 2020-05-23: qty 100

## 2020-05-23 MED ORDER — TETANUS-DIPHTH-ACELL PERTUSSIS 5-2.5-18.5 LF-MCG/0.5 IM SUSY: 0.5000 mL | PREFILLED_SYRINGE | Freq: Once | INTRAMUSCULAR | Status: DC

## 2020-05-23 SURGICAL SUPPLY — 40 items
BENZOIN TINCTURE PRP APPL 2/3 (GAUZE/BANDAGES/DRESSINGS) ×3 IMPLANT
CHLORAPREP W/TINT 26ML (MISCELLANEOUS) ×3 IMPLANT
CLAMP CORD UMBIL (MISCELLANEOUS) IMPLANT
CLOSURE STERI STRIP 1/2 X4 (GAUZE/BANDAGES/DRESSINGS) ×2 IMPLANT
CLOSURE WOUND 1/2 X4 (GAUZE/BANDAGES/DRESSINGS) ×1
CLOTH BEACON ORANGE TIMEOUT ST (SAFETY) ×3 IMPLANT
DRSG OPSITE POSTOP 4X10 (GAUZE/BANDAGES/DRESSINGS) ×3 IMPLANT
ELECT REM PT RETURN 9FT ADLT (ELECTROSURGICAL) ×3
ELECTRODE REM PT RTRN 9FT ADLT (ELECTROSURGICAL) ×1 IMPLANT
EXTRACTOR VACUUM M CUP 4 TUBE (SUCTIONS) IMPLANT
EXTRACTOR VACUUM M CUP 4' TUBE (SUCTIONS)
GAUZE SPONGE 4X4 12PLY STRL LF (GAUZE/BANDAGES/DRESSINGS) ×6 IMPLANT
GLOVE BIO SURGEON STRL SZ7.5 (GLOVE) ×3 IMPLANT
GLOVE BIOGEL PI IND STRL 7.0 (GLOVE) ×1 IMPLANT
GLOVE BIOGEL PI INDICATOR 7.0 (GLOVE) ×2
GOWN STRL REUS W/TWL LRG LVL3 (GOWN DISPOSABLE) ×6 IMPLANT
KIT ABG SYR 3ML LUER SLIP (SYRINGE) IMPLANT
NEEDLE HYPO 22GX1.5 SAFETY (NEEDLE) ×3 IMPLANT
NEEDLE HYPO 25X5/8 SAFETYGLIDE (NEEDLE) ×3 IMPLANT
NEEDLE SPNL 20GX3.5 QUINCKE YW (NEEDLE) IMPLANT
NS IRRIG 1000ML POUR BTL (IV SOLUTION) ×3 IMPLANT
PACK C SECTION WH (CUSTOM PROCEDURE TRAY) ×3 IMPLANT
PAD ABD 7.5X8 STRL (GAUZE/BANDAGES/DRESSINGS) ×3 IMPLANT
PENCIL SMOKE EVAC W/HOLSTER (ELECTROSURGICAL) ×3 IMPLANT
SPONGE GAUZE 4X4 12PLY STER LF (GAUZE/BANDAGES/DRESSINGS) ×3 IMPLANT
STRIP CLOSURE SKIN 1/2X4 (GAUZE/BANDAGES/DRESSINGS) ×2 IMPLANT
SUT MNCRL 0 VIOLET CTX 36 (SUTURE) ×2 IMPLANT
SUT MNCRL AB 3-0 PS2 27 (SUTURE) IMPLANT
SUT MON AB 2-0 CT1 27 (SUTURE) ×3 IMPLANT
SUT MON AB-0 CT1 36 (SUTURE) ×6 IMPLANT
SUT MONOCRYL 0 CTX 36 (SUTURE) ×4
SUT PLAIN 0 NONE (SUTURE) IMPLANT
SUT PLAIN 2 0 (SUTURE)
SUT PLAIN 2 0 XLH (SUTURE) IMPLANT
SUT PLAIN ABS 2-0 CT1 27XMFL (SUTURE) IMPLANT
SYR 20CC LL (SYRINGE) IMPLANT
SYR CONTROL 10ML LL (SYRINGE) ×6 IMPLANT
TOWEL OR 17X24 6PK STRL BLUE (TOWEL DISPOSABLE) ×3 IMPLANT
TRAY FOLEY W/BAG SLVR 14FR LF (SET/KITS/TRAYS/PACK) ×3 IMPLANT
WATER STERILE IRR 1000ML POUR (IV SOLUTION) ×3 IMPLANT

## 2020-05-23 NOTE — Lactation Note (Signed)
This note was copied from a baby's chart. Lactation Consultation Note  Patient Name: Carol Rocha [id number] Date: 05/23/2020 Reason for consult: Initial assessment;Term   P3 mother whose infant is now 2 hours old.  This is a term baby at 39+0 weeks.  Mother breast fed her first child (now 36 years old) for 3 months and her second child (now 55 years old) for one year.  She plans to exclusively breast feed this baby.  Baby was STS on mother's chest when I arrived.  Per father, mother fed approximately 10 minutes ago.  Reviewed feeding cues with parents.  Offered to review hand expression, however, mother will ask her RN later today for assistance due to baby doing STS.  Colostrum container provided and milk storage times reviewed.  Finger feeding demonstrated.  Mother will feed 8-12 times/24 hours or sooner if baby shows cues.  She will call her RN/LC for latch assistance as needed.  Mom made aware of O/P services, breastfeeding support groups, community resources, and our phone # for post-discharge questions.  Mother has a DEBP for home use.   Maternal Data Formula Feeding for Exclusion: No Has patient been taught Hand Expression?: Yes Does the patient have breastfeeding experience prior to this delivery?: Yes  Feeding Feeding Type: Breast Fed  LATCH Score Latch: Grasps breast easily, tongue down, lips flanged, rhythmical sucking.  Audible Swallowing: A few with stimulation  Type of Nipple: Everted at rest and after stimulation  Comfort (Breast/Nipple): Soft / non-tender  Hold (Positioning): Assistance needed to correctly position infant at breast and maintain latch.  LATCH Score: 8  Interventions Interventions: Breast feeding basics reviewed;Assisted with latch;Skin to skin;Breast compression;Adjust position;Support pillows  Lactation Tools Discussed/Used     Consult Status Consult Status: Follow-up Date: 05/24/20 Follow-up type: In-patient    Izzah Pasqua R  Jaquese Irving 05/23/2020, 11:31 AM

## 2020-05-23 NOTE — Anesthesia Procedure Notes (Signed)
Spinal  Patient location during procedure: OR Start time: 05/23/2020 8:15 AM End time: 05/23/2020 8:20 AM Staffing Anesthesiologist: Mellody Dance, MD Preanesthetic Checklist Completed: patient identified, IV checked, site marked, risks and benefits discussed, surgical consent, monitors and equipment checked, pre-op evaluation and timeout performed Spinal Block Patient position: sitting Prep: DuraPrep Patient monitoring: heart rate, cardiac monitor, continuous pulse ox and blood pressure Approach: midline Location: L3-4 Injection technique: single-shot Needle Needle type: Sprotte  Needle gauge: 24 G Needle length: 9 cm

## 2020-05-23 NOTE — Anesthesia Preprocedure Evaluation (Signed)
Anesthesia Evaluation  Patient identified by MRN, date of birth, ID band Patient awake    Reviewed: Allergy & Precautions, NPO status , Patient's Chart, lab work & pertinent test results  Airway Mallampati: I  TM Distance: >3 FB Neck ROM: Full    Dental no notable dental hx. (+) Teeth Intact, Dental Advisory Given   Pulmonary neg pulmonary ROS,    Pulmonary exam normal breath sounds clear to auscultation       Cardiovascular negative cardio ROS Normal cardiovascular exam Rhythm:Regular Rate:Normal     Neuro/Psych  Headaches, negative psych ROS   GI/Hepatic Neg liver ROS, hiatal hernia, GERD  ,  Endo/Other  negative endocrine ROS  Renal/GU negative Renal ROS  negative genitourinary   Musculoskeletal negative musculoskeletal ROS (+)   Abdominal   Peds negative pediatric ROS (+)  Hematology  (+) anemia ,   Anesthesia Other Findings   Reproductive/Obstetrics (+) Pregnancy                             Anesthesia Physical  Anesthesia Plan  ASA: II  Anesthesia Plan: Spinal   Post-op Pain Management:    Induction: Inhalational  PONV Risk Score and Plan: Treatment may vary due to age or medical condition  Airway Management Planned: Natural Airway  Additional Equipment:   Intra-op Plan:   Post-operative Plan:   Informed Consent: I have reviewed the patients History and Physical, chart, labs and discussed the procedure including the risks, benefits and alternatives for the proposed anesthesia with the patient or authorized representative who has indicated his/her understanding and acceptance.     Dental advisory given  Plan Discussed with: CRNA and Anesthesiologist  Anesthesia Plan Comments:         Anesthesia Quick Evaluation

## 2020-05-23 NOTE — Op Note (Signed)
Cesarean Section Procedure Note  Indications: patient declines vag del attempt and previous uterine incision kerr x one and history of 4th degree laceration  Pre-operative Diagnosis: 39 week 0 day pregnancy.  Post-operative Diagnosis: same , thin LUS  Surgeon: Lenoard Aden   Assistants: Yetta Barre, CNM  Anesthesia: Local anesthesia 0.25.% bupivacaine and Spinal anesthesia  ASA Class: 2  Procedure Details  The patient was seen in the Holding Room. The risks, benefits, complications, treatment options, and expected outcomes were discussed with the patient.  The patient concurred with the proposed plan, giving informed consent. The risks of anesthesia, infection, bleeding and possible injury to other organs discussed. Injury to bowel, bladder, or ureter with possible need for repair discussed. Possible need for transfusion with secondary risks of hepatitis or HIV acquisition discussed. Post operative complications to include but not limited to DVT, PE and Pneumonia noted. The site of surgery properly noted/marked. The patient was taken to Operating Room # C, identified as Carol Rocha and the procedure verified as C-Section Delivery. A Time Out was held and the above information confirmed.  After induction of anesthesia, the patient was draped and prepped in the usual sterile manner. A Pfannenstiel incision was made and carried down through the subcutaneous tissue to the fascia. Fascial incision was made and extended transversely using Mayo scissors. The fascia was separated from the underlying rectus tissue superiorly and inferiorly. The peritoneum was identified and entered. Peritoneal incision was extended longitudinally. The utero-vesical peritoneal reflection was incised transversely and the bladder flap was bluntly freed from the lower uterine segment. A low transverse uterine incision(Kerr hysterotomy) was made. Thin LUS noted. No window. Delivered from OA presentation was a  female with Apgar  scores of 9 at one minute and 9 at five minutes. Bulb suctioning gently performed. Neonatal team in attendance.After the umbilical cord was clamped and cut cord blood was obtained for evaluation. The placenta was removed intact and appeared normal. The uterus was curetted with a dry lap pack. Good hemostasis was noted.The uterine outline, tubes and ovaries appeared normal. The uterine incision was closed with running locked sutures of 0 Monocryl x 2 layers. Hemostasis was observed. Lavage was carried out until clear.The parietal peritoneum was closed with a running 2-0 Monocryl suture. The fascia was then reapproximated with running sutures of 0 Monocryl. The skin was reapproximated with 3-0 monocryl after [place] closure with 2-0 plain.  Instrument, sponge, and needle counts were correct prior the abdominal closure and at the conclusion of the case.   Findings: FTLM , OA, nl uterus and nl adnexa  Estimated Blood Loss:  500         Drains: foley                 Specimens: placenta                 Complications:  None; patient tolerated the procedure well.         Disposition: PACU - hemodynamically stable.         Condition: stable  Attending Attestation: I performed the procedure.

## 2020-05-23 NOTE — Transfer of Care (Signed)
Immediate Anesthesia Transfer of Care Note  Patient: Carol Rocha  Procedure(s) Performed: Repeat CESAREAN SECTION (N/A )  Patient Location: PACU  Anesthesia Type:Spinal  Level of Consciousness: awake, alert  and oriented  Airway & Oxygen Therapy: Patient Spontanous Breathing  Post-op Assessment: Report given to RN and Post -op Vital signs reviewed and stable  Post vital signs: Reviewed and stable  Last Vitals:  Vitals Value Taken Time  BP 100/57 05/23/20 0931  Temp 36.5 C 05/23/20 0931  Pulse 83 05/23/20 0942  Resp 15 05/23/20 0942  SpO2 100 % 05/23/20 0942  Vitals shown include unvalidated device data.  Last Pain:  Vitals:   05/23/20 0931  TempSrc: Oral  PainSc: 0-No pain         Complications: No complications documented.

## 2020-05-23 NOTE — H&P (Signed)
Patient seen and examined. Consent witnessed and signed. No changes noted. Update completed. BP 120/78 (BP Location: Left Arm)   Temp 98 F (36.7 C) (Oral)   Ht 5\' 3"  (1.6 m)   Wt 81.8 kg   SpO2 100%   BMI 31.94 kg/m   CBC    Component Value Date/Time   WBC 10.6 (H) 05/21/2020 1025   RBC 3.70 (L) 05/21/2020 1025   HGB 10.8 (L) 05/21/2020 1025   HCT 34.1 (L) 05/21/2020 1025   PLT 298 05/21/2020 1025   MCV 92.2 05/21/2020 1025   MCH 29.2 05/21/2020 1025   MCHC 31.7 05/21/2020 1025   RDW 13.3 05/21/2020 1025   LYMPHSABS 4.3 (H) 08/07/2015 0536   MONOABS 2.0 (H) 08/07/2015 0536   EOSABS 0.4 08/07/2015 0536   BASOSABS 0.0 08/07/2015 0536

## 2020-05-23 NOTE — H&P (Signed)
Carol Rocha is a 34 y.o. female presenting for rpt csection. OB History    Gravida  3   Para  2   Term  2   Preterm  0   AB  0   Living  2     SAB  0   IAB  0   Ectopic  0   Multiple  0   Live Births  2          Past Medical History:  Diagnosis Date  . Dysmenorrhea   . GERD (gastroesophageal reflux disease)   . Hiatal hernia   . History of chicken pox   . IBS (irritable bowel syndrome) dx-2015  . Migraine    Past Surgical History:  Procedure Laterality Date  . ANAL SPHINCTEROPLASTY    . CESAREAN SECTION N/A 04/14/2018   Procedure: Primary CESAREAN SECTION;  Surgeon: Carol Mackie, MD;  Location: [facility] BIRTHING SUITES;  Service: Obstetrics;  Laterality: N/A;  EDD: 04/19/18  . pilonidal cyst removal  09/2008  . WISDOM TOOTH EXTRACTION     Family History: family history includes Breast cancer in her maternal grandmother; COPD in her father; Hypertension in her father; Hypothyroidism in her sister; Pancreatic cancer in her brother and mother; Varicose Veins in her father and mother. Social History:  reports that she has never smoked. She has never used smokeless tobacco. She reports that she does not drink alcohol and does not use drugs.     Maternal Diabetes: No Genetic Screening: Normal Maternal Ultrasounds/Referrals: Normal Fetal Ultrasounds or other Referrals:  None Maternal Substance Abuse:  No Significant Maternal Medications:  None Significant Maternal Lab Results:  Group B Strep negative Other Comments:  None  Review of Systems  Constitutional: Negative.   All other systems reviewed and are negative.  Maternal Medical History:  Contractions: Frequency: rare.   Perceived severity is mild.    Fetal activity: Perceived fetal activity is normal.   Last perceived fetal movement was within the past hour.    Prenatal complications: no prenatal complications Prenatal Complications - Diabetes: none.      unknown if currently  breastfeeding. Maternal Exam:  Uterine Assessment: Contraction strength is mild.  Contraction frequency is rare.   Abdomen: Patient reports no abdominal tenderness. Surgical scars: low transverse.   Fetal presentation: vertex  Introitus: Normal vulva. Normal vagina.  Ferning test: not done.  Nitrazine test: not done. Amniotic fluid character: not assessed.  Pelvis: questionable for delivery.   Cervix: Cervix evaluated by digital exam.     Physical Exam Constitutional:      Appearance: Normal appearance.  HENT:     Head: Normocephalic and atraumatic.  Cardiovascular:     Rate and Rhythm: Normal rate and regular rhythm.     Pulses: Normal pulses.     Heart sounds: Normal heart sounds.  Pulmonary:     Effort: Pulmonary effort is normal.     Breath sounds: Normal breath sounds.  Abdominal:     Palpations: Abdomen is soft.  Genitourinary:    General: Normal vulva.  Musculoskeletal:        General: Normal range of motion.     Cervical back: Normal range of motion and neck supple.  Skin:    General: Skin is warm and dry.  Neurological:     General: No focal deficit present.     Mental Status: She is alert and oriented to person, place, and time.  Psychiatric:        Mood and Affect:  Mood normal.        Behavior: Behavior normal.     Prenatal labs: ABO, Rh: --/--/O POS (12/20 1034) Antibody: NEG (12/20 1034) Rubella:   RPR: NON REACTIVE (12/20 1025)  HBsAg:   neg HIV:   neg GBS:   neg  Assessment/Plan: 39 wk IUP Previous csection for rpt Surgical risks discussed. Risks of infection, bleeding , possible injury to surrounding organs with need for repair noted. Possible need for transfusion noted and acknowledged. Consent done.   Carol Rocha J 05/23/2020, 6:38 AM

## 2020-05-23 NOTE — Progress Notes (Signed)
Patient due to arrive for pre-op between 0600-0615. Number provided in chart called with no answer, left message. Carol Rocha

## 2020-05-23 NOTE — Progress Notes (Signed)
Orthostatic vitals attempted. Patient unable to stand. Patient felt weak while trying to stand. Will try again after patient has rested some more and ate dinner. Peri care/foley care performed at bedside.

## 2020-05-23 NOTE — Anesthesia Postprocedure Evaluation (Signed)
Anesthesia Post Note  Patient: Carol Rocha  Procedure(s) Performed: Repeat CESAREAN SECTION (N/A )     Patient location during evaluation: Mother Baby Anesthesia Type: Spinal Level of consciousness: oriented and awake and alert Pain management: pain level controlled Vital Signs Assessment: post-procedure vital signs reviewed and stable Respiratory status: spontaneous breathing and respiratory function stable Cardiovascular status: blood pressure returned to baseline and stable Postop Assessment: no headache, no backache, no apparent nausea or vomiting and spinal receding Anesthetic complications: no   No complications documented.  Last Vitals:  Vitals:   05/23/20 1015 05/23/20 1030  BP: 110/71 97/85  Pulse: 80 84  Resp: 15 15  Temp:    SpO2: 100% 100%    Last Pain:  Vitals:   05/23/20 1030  TempSrc:   PainSc: 0-No pain                 Mellody Dance

## 2020-05-24 ENCOUNTER — Encounter (HOSPITAL_COMMUNITY): Payer: Self-pay | Admitting: Obstetrics and Gynecology

## 2020-05-24 LAB — CBC
HCT: 27 % — ABNORMAL LOW (ref 36.0–46.0)
Hemoglobin: 8.6 g/dL — ABNORMAL LOW (ref 12.0–15.0)
MCH: 29 pg (ref 26.0–34.0)
MCHC: 31.9 g/dL (ref 30.0–36.0)
MCV: 90.9 fL (ref 80.0–100.0)
Platelets: 253 10*3/uL (ref 150–400)
RBC: 2.97 MIL/uL — ABNORMAL LOW (ref 3.87–5.11)
RDW: 13.3 % (ref 11.5–15.5)
WBC: 13.6 10*3/uL — ABNORMAL HIGH (ref 4.0–10.5)
nRBC: 0 % (ref 0.0–0.2)

## 2020-05-24 LAB — BIRTH TISSUE RECOVERY COLLECTION (PLACENTA DONATION)

## 2020-05-24 MED ORDER — SODIUM CHLORIDE 0.9 % IV SOLN
500.0000 mg | Freq: Once | INTRAVENOUS | Status: AC
  Administered 2020-05-24: 16:00:00 500 mg via INTRAVENOUS
  Filled 2020-05-24 (×2): qty 25

## 2020-05-24 MED ORDER — MAGNESIUM OXIDE 400 (241.3 MG) MG PO TABS
400.0000 mg | ORAL_TABLET | Freq: Every day | ORAL | Status: DC
  Administered 2020-05-25 – 2020-05-26 (×2): 400 mg via ORAL
  Filled 2020-05-24 (×2): qty 1

## 2020-05-24 MED ORDER — POLYSACCHARIDE IRON COMPLEX 150 MG PO CAPS
150.0000 mg | ORAL_CAPSULE | Freq: Every day | ORAL | Status: DC
  Administered 2020-05-25 – 2020-05-26 (×2): 150 mg via ORAL
  Filled 2020-05-24 (×2): qty 1

## 2020-05-24 NOTE — Progress Notes (Signed)
Pt up and ambulated to bathroom for peri care.  Slow and steady gait. States that legs still very heavy "like bricks".  Denies pain but feels some pressure.  Peri care given with instructions.  Back to bed without incident.

## 2020-05-24 NOTE — Progress Notes (Signed)
POSTOPERATIVE DAY # 1 S/P Repeat LTCS, baby boy "Dominic"   S:         Reports feeling okay, very tired. Reports an episode of confusion last night, was not sure where she was. Husband states she has not slept at all. Denies hx of confusion, altered mental status, or psychosis. Oriented x 4 currently. Reports mild dizziness with ambulation.              Tolerating po intake / no  nausea / no vomiting / + flatus / no BM  Denies SOB, or CP             Bleeding is light             Pain controlled withMotrin, Tylenol, Percoct             Up ad lib / ambulatory/ voiding QS x 2 since foley removal   Newborn breast feeding - going okay reports some mild nipple soreness at baby has a shallow latch  / Circumcision - planning prior to d/c   O:  VS: BP 102/60 (BP Location: Right Arm)   Pulse 76   Temp 98.1 F (36.7 C) (Oral)   Resp 16   Ht 5\' 3"  (1.6 m)   Wt 81.8 kg   SpO2 98%   Breastfeeding Unknown   BMI 31.94 kg/m   Patient Vitals for the past 24 hrs:  BP Temp Temp src Pulse Resp SpO2  05/24/20 0500 102/60 98.1 F (36.7 C) Oral 76 16 --  05/24/20 0400 -- -- -- -- 20 --  05/24/20 0300 -- -- -- -- 18 --  05/24/20 0200 (!) 109/57 98.5 F (36.9 C) Oral 89 18 --  05/24/20 0100 -- -- -- -- 19 --  05/24/20 0000 -- -- -- -- 18 --  05/23/20 2334 -- -- -- -- 16 --  05/23/20 2200 -- -- -- -- 18 --  05/23/20 2100 -- -- -- -- 17 --  05/23/20 1930 -- -- -- -- 16 --  05/23/20 1815 -- 98.2 F (36.8 C) Oral -- 18 98 %  05/23/20 1438 (!) 100/57 98.1 F (36.7 C) Oral 81 16 97 %  05/23/20 1315 (!) 109/55 97.7 F (36.5 C) Oral 83 18 98 %  05/23/20 1215 101/65 98 F (36.7 C) Oral 83 18 100 %  05/23/20 1100 107/71 97.6 F (36.4 C) Oral 70 18 --  05/23/20 1030 97/85 -- -- 84 15 100 %  05/23/20 1015 110/71 -- -- 80 15 100 %  05/23/20 1000 (!) 129/108 -- -- 84 15 98 %  05/23/20 0945 118/69 -- -- 82 13 100 %     LABS:              Recent Labs    05/21/20 1025 05/24/20 0456  WBC 10.6* 13.6*   HGB 10.8* 8.6*  PLT 298 253               Bloodtype: --/--/O POS (12/20 1034)  Rubella: Immune (05/27 0000)                                             I&O: Intake/Output      12/22 0701 12/23 0700 12/23 0701 12/24 0700   I.V. (mL/kg) 2555.7 (31.2)    Total Intake(mL/kg) 2555.7 (31.2)    Urine (mL/kg/hr) 3400 (1.7)  Blood 309    Total Output 3709    Net -1153.3         Urine Occurrence 1 x     Flu:  Covid: Tdap:            Physical Exam:             Alert and Oriented X3  Lungs: Clear and unlabored  Heart: regular rate and rhythm / no murmurs  Abdomen: soft, appropriately tender, moderate gaseous distention, active bowel sounds in all quadrants              Fundus: firm, non-tender, below umbilicus              Dressing: scant marked drainage, otherwise c/d/i              Incision:  approximated with sutures / no erythema / no ecchymosis / no drainage  Perineum: intact  Lochia: small rubra on pad   Extremities: no edema, no calf pain or tenderness,   A/P:     POD # 1 S/P Repeat LTCS  Confusion/altered mental state   - Brief incidence last night, appears to have resolved   - Rest encouraged    - Advised to notify for worsening s/s             ABL Anemia compounding chronic IDA   - Venofer 500mg  IVBP x 1 dose   - Tomorrow, begin oral Niferex 150mg  daily and Magnesium oxide 400mg  PO daily  Routine postoperative care              Lactation support PRN for shallow latch  Possibly interested in early d/c home tomorrow   , MSN, CNM [facility]

## 2020-05-25 NOTE — Lactation Note (Signed)
This note was copied from a baby's chart. Lactation Consultation Note  Patient Name: Carol Rocha [id number] Date: 05/25/2020 Reason for consult: Follow-up assessment;Term;Infant weight loss Age:36 hours P3, term female infant with -7% weight loss. Per mom, she had pain with few latches, LC notice nipple strips on both breast, mom will start using comfort gels she received them today. LC reviewed hand expression and infant was given 3 mls of colostrum by spoon prior to latching at the breast.  LC discussed breast stimulation to keep infant awake for breastfeeding such as : gently stroking infant's shoulder and neck, doing breast compressions and talking to infant.  Infant was cuing to BF, help assist with latch, mom latched infant on her right breast using the football hold position, infant latched with depth, swallows observed, infant was still breastfeeding after 15 minutes when LC left the room. Per mom, this latch felt much better, mom knows if she feels pain and not a tug to break latch and re-latch infant if she needs further assistance with latch to call RN or LC. Mom will continue to BF infant according to hunger cues, 8 to 12+ times within 24 hours. Mom know she can hand express after latching infant at the breast to give back extra volume after each feeding.  Maternal Data    Feeding Feeding Type: Breast Fed  LATCH Score Latch: Grasps breast easily, tongue down, lips flanged, rhythmical sucking.  Audible Swallowing: Spontaneous and intermittent  Type of Nipple: Everted at rest and after stimulation  Comfort (Breast/Nipple): Filling, red/small blisters or bruises, mild/mod discomfort  Hold (Positioning): Assistance needed to correctly position infant at breast and maintain latch.  LATCH Score: 8  Interventions Interventions: Skin to skin;Breast compression;Adjust position;Support pillows;Breast massage;Hand express;Position options;Expressed milk  Lactation Tools  Discussed/Used     Consult Status Consult Status: Follow-up Date: 05/26/20 Follow-up type: In-patient    [name] 05/25/2020, 8:52 PM

## 2020-05-25 NOTE — Progress Notes (Signed)
POSTOPERATIVE DAY # 2 S/P Repeat LTCS, baby boy "Dominic"   S:         Reports feeling okay, very tired. Reports an episode of confusion last nigh but nurse noted 2 scop patches applied and removed this am.Denies hx of confusion, altered mental status, or psychosis. Oriented x 4 currently. Reports mild dizziness with ambulation.              Tolerating po intake / no  nausea / no vomiting / + flatus / no BM  Denies SOB, or CP             Bleeding is light             Pain controlled withMotrin, Tylenol, Percoct             Up ad lib / ambulatory/ voiding QS x 2 since foley removal   Newborn breast feeding - going okay reports some mild nipple soreness at baby has a shallow latch     O:  VS: BP 114/74 (BP Location: Right Arm)   Pulse 89   Temp 98.3 F (36.8 C) (Oral)   Resp 16   Ht 5\' 3"  (1.6 m)   Wt 81.8 kg   SpO2 99%   Breastfeeding Unknown   BMI 31.94 kg/m   Patient Vitals for the past 24 hrs:  BP Temp Temp src Pulse Resp SpO2  05/25/20 0522 114/74 98.3 F (36.8 C) Oral 89 16 99 %  05/24/20 2323 (!) 101/59 97.7 F (36.5 C) Oral 71 16 99 %  05/24/20 1433 102/62 98 F (36.7 C) Oral 75 18 --     LABS:               Recent Labs    05/24/20 0456  WBC 13.6*  HGB 8.6*  PLT 253               Bloodtype: --/--/O POS (12/20 1034)  Rubella: Immune (05/27 0000)                                             I&O: Intake/Output      12/23 0701 12/24 0700 12/24 0701 12/25 0700   I.V. (mL/kg)     Total Intake(mL/kg)     Urine (mL/kg/hr) 800 (0.4)    Blood     Total Output 800    Net -800          Flu:  Covid: Tdap:            Physical Exam:             Alert and Oriented X3  Lungs: Clear and unlabored  Heart: regular rate and rhythm / no murmurs  Abdomen: soft, appropriately tender, moderate gaseous distention, active bowel sounds in all quadrants              Fundus: firm, non-tender, below umbilicus              Dressing: scant marked drainage, otherwise c/d/i               Incision:  approximated with sutures / no erythema / no ecchymosis / no drainage  Perineum: intact  Lochia: small rubra on pad   Extremities: no edema, no calf pain or tenderness,   A/P:     POD # 2 S/P Repeat LTCS  Confusion/altered mental stat-likely due to scop patch   - Brief incidence last night, appears to have resolved   - Rest encouraged    - Advised to notify for worsening s/s             ABL Anemia compounding chronic IDA   - Venofer 500mg  IVBP x 1 dose   -  oral Niferex 150mg  daily and Magnesium oxide 400mg  PO daily  Routine postoperative care              Lactation support PRN for shallow latch  d/c home tomorrow   , MSN, CNM [facility]

## 2020-05-25 NOTE — Lactation Note (Addendum)
This note was copied from a baby's chart. Lactation Consultation Note  Patient Name: Carol Rocha ONGEX'B Date: 05/25/2020 Reason for consult: Follow-up assessment;Term;Infant weight loss Age:36 hours Baby with 7.31% wt loss. Mom sitting in bed holding baby, dad sitting in chair eating lunch. Mom reports baby just finished nursing, feeding lasted ~23mins. Mom reports feedings are going well, hears swallows and feels baby latches well to breast, baby is pooping and peeing feeding q2.5hrs, but is now unsure regarding BF status since baby has lost weight, also states baby falls asleep during feedings. Baby rooting and sucking on hands, mom agreed to put baby back to breast. Mom reports first child had a tongue tie with repair, denies noting much improvement with feedings.  Right nipple with healing scab noted at center, nipple erect and with no other signs of damage. Mom latched to right breast semi laid back position, baby with tight angle, bottom lip tucked, mom reports difficulty untucking lip. Multiple audible swallows noted, taught breast compressions, advised mom with next feeding sandwich breast then latch to help achieve deeper latch, always nurse with baby skin to skin, feed on cue, wake if >3hrs since last feeding. Comfort Gels given with instructions. Advised mom to call if latch assessment needed with next feeding. Mom voiced understanding and with no further concerns. Left the room with baby still nursing ~24min mark. BGilliam, RN, IBCLC  Addendum:Plan - feed on cue, wake if >3hrs since last feeding - sandwich breast to help achieve deeper latch - breast compression with feedings - skin to skin with each feeding - consider hand pump and supplementing with EBM (RN aware of this plan)  Feeding Feeding Type: Breast Fed  LATCH Score Latch: Grasps breast easily, tongue down, lips flanged, rhythmical sucking.  Audible Swallowing: Spontaneous and intermittent  Type of Nipple:  Everted at rest and after stimulation  Comfort (Breast/Nipple): Filling, red/small blisters or bruises, mild/mod discomfort  Hold (Positioning): No assistance needed to correctly position infant at breast.  LATCH Score: 9  Interventions Interventions: Breast feeding basics reviewed;Assisted with latch;Skin to skin;Breast compression   Consult Status Consult Status: Follow-up Date: 05/25/20 Follow-up type: In-patient    [name] 05/25/2020, 2:15 PM

## 2020-05-26 MED ORDER — POLYSACCHARIDE IRON COMPLEX 150 MG PO CAPS: 150.0000 mg | ORAL_CAPSULE | Freq: Every day | ORAL | 3 refills | Status: AC

## 2020-05-26 MED ORDER — IBUPROFEN 800 MG PO TABS
800.0000 mg | ORAL_TABLET | Freq: Four times a day (QID) | ORAL | 0 refills | Status: AC
Start: 2020-05-26 — End: ?

## 2020-05-26 MED ORDER — OXYCODONE-ACETAMINOPHEN 5-325 MG PO TABS: 1.0000 | ORAL_TABLET | ORAL | 0 refills | Status: AC | PRN

## 2020-05-26 MED ORDER — PRENATAL MULTIVITAMIN CH
1.0000 | ORAL_TABLET | Freq: Every day | ORAL | Status: AC
Start: 2020-05-26 — End: ?

## 2020-05-26 MED ORDER — SENNOSIDES-DOCUSATE SODIUM 8.6-50 MG PO TABS: 2.0000 | ORAL_TABLET | Freq: Every evening | ORAL | 0 refills | Status: AC | PRN

## 2020-05-26 MED ORDER — COCONUT OIL OIL: 1.0000 "application " | TOPICAL_OIL | 0 refills | Status: AC | PRN

## 2020-05-26 NOTE — Progress Notes (Signed)
POSTOPERATIVE DAY # 3 S/P Elective Repeat LTCS, baby boy "Dominic"   S:         Reports feeling better today; no confusion episodes. States she is still having some weakness in legs bilaterally and feels like she need to hold on to the railing with walking. Feels like she can be discharged home today. Questions if she should use a walker for support. Denies numbness or tingling.  Has walked the halls once without difficulty.  Reports incisional pain with ambulation              Tolerating po intake / no nausea / no vomiting / + flatus / + BM  Denies dizziness, SOB, or CP             Bleeding is getting lighter              Pain controlled with Motrin and Percocet             Up ad lib / ambulatory/ voiding QS without difficulty  Newborn breast feeding  / Circumcision completed   O:  VS: BP 118/76 (BP Location: Right Arm)   Pulse 86   Temp 97.7 F (36.5 C) (Oral)   Resp 16   Ht 5\' 3"  (1.6 m)   Wt 81.8 kg   SpO2 100%   Breastfeeding Unknown   BMI 31.94 kg/m  Patient Vitals for the past 24 hrs:  BP Temp Temp src Pulse Resp SpO2  05/26/20 0550 118/76 97.7 F (36.5 C) Oral 86 16 100 %  05/25/20 2012 114/70 98.2 F (36.8 C) Oral 94 18 99 %    LABS:              Recent Labs    05/24/20 0456  WBC 13.6*  HGB 8.6*  PLT 253               Bloodtype: --/--/O POS (12/20 1034)  Rubella: Immune (05/27 0000)                                             I&O: Intake/Output      12/24 0701 12/25 0700 12/25 0701 12/26 0700   Urine (mL/kg/hr)     Total Output     Net                       Physical Exam:           General: alert, cooperative and no distress  Heart: RRR Lungs: clear equal Abdomen: appropriate tenderness, active bowel sounds in all quadrants Lochia: appropriate Uterine Fundus: firm below the uterus Incision: Healing well with no significant drainage, Dressing is clean, dry, and intact DVT Evaluation: No evidence of DVT seen on physical exam. Trace Calf/Ankle edema  is present   POD # 3 S/P Repeat LTCS             Confusion/altered mental stat-likely due to scop patch                         - Resolved completely            ABL Anemia compounding chronic IDA                         - Venofer 500mg  IVBP  x 1 dose                         -  oral Niferex 150mg  daily and Magnesium oxide 400mg  PO daily  Lower extremity weakness   - likely due to limited mobility since she has been in the hospital with compounding blood loss anemia    - Encouraged ambulation and recommend having some assistance at home in the event she needs help   - Abdominal binder to aid in abdominal support              Routine postoperative care              Lactation support PRN  Multiple questions answered regarding discharge care/activity  Discharge home  Plan for PP visit in 6 weeks. We reviewed OV visit in 1 week if still feeling weak. Pt. And husband to call office for appt on Monday.   , MSN, CNM [facility]

## 2020-05-26 NOTE — Discharge Summary (Signed)
Postpartum Discharge Summary  Date of Service updated 05/26/2020     Patient Name: Carol Rocha DOB: 1986/05/22 MRN: [id number]  Date of admission: 05/23/2020 Delivery date:05/23/2020  Delivering provider: Brien Few  Date of discharge: 05/26/2020  Admitting diagnosis: Previous cesarean section [Z98.891] Previous cesarean delivery affecting pregnancy [O34.219] Intrauterine pregnancy: [redacted]w[redacted]d    Secondary diagnosis:  Principal Problem:   Postpartum care following cesarean delivery 12/22 Active Problems:   Previous cesarean section   Status post repeat low transverse cesarean section 12/22   Previous cesarean delivery affecting pregnancy  Additional problems: ABL Anemia compounding chronic IDA   Discharge diagnosis: Term Pregnancy Delivered and Anemia                                              Post partum procedures: IV Venofer x 1 dose Augmentation: N/A Complications: None  Hospital course: Sceduled C/S   34y.o. yo G3P3003 at 323w0das admitted to the hospital 05/23/2020 for scheduled cesarean section with the following indication:Elective Repeat.Delivery details are as follows:  Membrane Rupture Time/Date: 8:49 AM ,05/23/2020   Delivery Method:C-Section, Low Transverse  Details of operation can be found in separate operative note.  Patient had an uncomplicated postpartum course.  She is ambulating, tolerating a regular diet, passing flatus, and urinating well. Patient is discharged home in stable condition on  05/26/20        Newborn Data: Birth date:05/23/2020  Birth time:8:49 AM  Gender:Female  Living status:Living  Apgars:9 ,9  Weight:3490 g     Magnesium Sulfate received: No BMZ received: No Rhophylac:N/A MMR:N/A T-DaP: n/a Flu: No Transfusion:No  Physical exam  Vitals:   05/24/20 2323 05/25/20 0522 05/25/20 2012 05/26/20 0550  BP: (!) 101/59 114/74 114/70 118/76  Pulse: 71 89 94 86  Resp: _0 Temp: 97.7 F (36.5 C) 98.3 F (36.8 C)  98.2 F (36.8 C) 97.7 F (36.5 C)  TempSrc: Oral Oral Oral Oral  SpO2: 99% 99% 99% 100%  Weight:      Height:       General: alert, cooperative and no distress  Heart: RRR Lungs: clear equal Abdomen: appropriate tenderness, active bowel sounds in all quadrants Lochia: appropriate Uterine Fundus: firm below the uterus Incision: Healing well with no significant drainage, Dressing is clean, dry, and intact DVT Evaluation: No evidence of DVT seen on physical exam. Trace Calf/Ankle edema is present Labs: Lab Results  Component Value Date   WBC 13.6 (H) 05/24/2020   HGB 8.6 (L) 05/24/2020   HCT 27.0 (L) 05/24/2020   MCV 90.9 05/24/2020   PLT 253 05/24/2020   CMP Latest Ref Rng & Units 10/07/2017  Glucose 65 - 99 mg/dL 103(H)  BUN 6 - 20 mg/dL 13  Creatinine 0.44 - 1.00 mg/dL 0.49  Sodium 135 - 145 mmol/L 137  Potassium 3.5 - 5.1 mmol/L 3.8  Chloride 101 - 111 mmol/L 105  CO2 22 - 32 mmol/L 23  Calcium 8.9 - 10.3 mg/dL 8.7(L)  Total Protein 6.5 - 8.1 g/dL 6.9  Total Bilirubin 0.3 - 1.2 mg/dL 0.5  Alkaline Phos 38 - 126 U/L 63  AST 15 - 41 U/L 21  ALT 14 - 54 U/L 31   Edinburgh Score: Edinburgh Postnatal Depression Scale Screening Tool 05/23/2020  I have been able to laugh and see the funny side of  things. 0  I have looked forward with enjoyment to things. 0  I have blamed myself unnecessarily when things went wrong. 1  I have been anxious or worried for no good reason. 0  I have felt scared or panicky for no good reason. 0  Things have been getting on top of me. 0  I have been so unhappy that I have had difficulty sleeping. 0  I have felt sad or miserable. 0  I have been so unhappy that I have been crying. 0  The thought of harming myself has occurred to me. 0  Edinburgh Postnatal Depression Scale Total 1      After visit meds:  Allergies as of 05/26/2020      Reactions   Pineapple Itching, Swelling      Medication List    TAKE these medications   coconut oil  Oil Apply 1 application topically as needed.   ibuprofen 800 MG tablet Commonly known as: ADVIL Take 1 tablet (800 mg total) by mouth every 6 (six) hours.   iron polysaccharides 150 MG capsule Commonly known as: NIFEREX Take 1 capsule (150 mg total) by mouth daily. Start taking on: May 27, 2020   oxyCODONE-acetaminophen 5-325 MG tablet Commonly known as: PERCOCET/ROXICET Take 1-2 tablets by mouth every 4 (four) hours as needed for moderate pain.   prenatal multivitamin Tabs tablet Take 1 tablet by mouth daily. What changed: when to take this   senna-docusate 8.6-50 MG tablet Commonly known as: Senokot-S Take 2 tablets by mouth at bedtime as needed for mild constipation.        Discharge home in stable condition Infant Feeding: Breast Infant Disposition:home with mother Discharge instruction: per After Visit Summary and Postpartum booklet. Activity: Advance as tolerated. Pelvic rest for 6 weeks.  Diet: low salt diet Anticipated Birth Control: not discussed Postpartum Appointment:6 weeks Additional Postpartum F/U: Postpartum Depression checkup Future Appointments:No future appointments. Follow up Visit:  Follow-up Information    Brien Few, MD. Schedule an appointment as soon as possible for a visit in 6 week(s).   Specialty: Obstetrics and Gynecology Why: Postpartum visit Contact information: [place] [address] [phone]                   05/26/2020 Darliss Cheney, CNM

## 2020-05-26 NOTE — Lactation Note (Signed)
This note was copied from a baby's chart. Lactation Consultation Note  Patient Name: Carol Rocha Date: 05/26/2020 Reason for consult: Follow-up assessment Age:36 hours  Follow up with 73 hours old infant with 7.56% weight loss at the time of visit. Infant is breastfeeding upon arrival. Mother reports breastfeeding is going well but infant seems to reposition to a shallow latch. Parents are correcting to flange lips and tugging infant's chin. Mother unlatched infant and LC observed slanted nipple with a slight compression stripe on top of right nipple. Mother explains she has been using comfort gels and that seems to help. Encouraged mother to apply EBM to nipples, air-dry and then using comfort gels. Reviewed hand expression technique. Mother is able to replicate.  LC noted breast fullness, firmness in her breasts. Discussed engorgement signs and what to expect with milk coming in. Discussed using ice for 15 minutes for comfort and heat only for letdown when actively breastfeeding or pumping.  Father of infant changed void and stool diaper. Infant is showing hunger cues again. Observed wide gape and mother latched infant with ease. Reinforced the importance of stimulating infant to be awake at feedings for a better latch and effective milk transfer.  Reviewed hunger and fullness cues, signs of intake, feeding 8-12 times in 24 h period, normal newborn behavior and cluster feeding. Reviewed Lactation Services brochure. Praised parents for their efforts and dedication. Infant is still breastfeeding upon LC leaving room.   Discharge Plan: 1-Breastfeeding often until breast feel empty and how to use ice and massage for relief.  2-Apply EBM to nipples, air-dry and then using comfort gels for nipple relief. 3-Ensuring infant has a deep latch and/or chin tugging to improve latch. 4-Contacting LC services for support as needed for any questions or concerns.    Maternal Data Formula  Feeding for Exclusion: No Has patient been taught Hand Expression?: Yes  Feeding Feeding Type: Breast Fed  LATCH Score   Audible Swallowing: Spontaneous and intermittent  Type of Nipple: Everted at rest and after stimulation  Comfort (Breast/Nipple): Filling, red/small blisters or bruises, mild/mod discomfort  Hold (Positioning): No assistance needed to correctly position infant at breast.    Interventions Interventions: Breast feeding basics reviewed;Hand express;Breast massage;Comfort gels;Expressed milk  Consult Status Consult Status: Complete Date: 05/26/20 Follow-up type: In-patient    Sally-Anne Wamble A Higuera Ancidey 05/26/2020, 9:58 AM

## 2020-12-17 ENCOUNTER — Telehealth: Payer: Self-pay | Admitting: Genetic Counselor

## 2020-12-17 NOTE — Telephone Encounter (Signed)
Received a genetic counseling referral from Dr. Wynelle Link for Family history of malignant neoplasm of gastrointestinal tract. Carol Rocha returned my call and has been scheduled to see Cari on 7/28 at 9am. Pt aware to arrive 15 minutes early.

## 2020-12-27 ENCOUNTER — Other Ambulatory Visit: Payer: 59

## 2020-12-27 ENCOUNTER — Encounter: Payer: 59 | Admitting: Genetic Counselor

## 2020-12-27 ENCOUNTER — Other Ambulatory Visit: Payer: Self-pay

## 2021-07-29 ENCOUNTER — Other Ambulatory Visit: Payer: Self-pay | Admitting: Surgery

## 2021-07-29 DIAGNOSIS — K429 Umbilical hernia without obstruction or gangrene: Secondary | ICD-10-CM

## 2021-09-05 ENCOUNTER — Other Ambulatory Visit: Payer: 59

## 2021-09-05 ENCOUNTER — Ambulatory Visit
Admission: RE | Admit: 2021-09-05 | Discharge: 2021-09-05 | Disposition: A | Discharge status: Home / Self Care | Payer: 59 | Source: Ambulatory Visit | Attending: Surgery | Admitting: Surgery

## 2021-09-05 DIAGNOSIS — K429 Umbilical hernia without obstruction or gangrene: Secondary | ICD-10-CM

## 2021-09-05 MED ORDER — IOPAMIDOL (ISOVUE-300) INJECTION 61%
100.0000 mL | Freq: Once | INTRAVENOUS | Status: AC | PRN
  Administered 2021-09-05: 100 mL via INTRAVENOUS

## 2021-09-12 ENCOUNTER — Encounter: Payer: Self-pay | Admitting: Surgery
